# Patient Record
Sex: Female | Born: 1975
Health system: Southern US, Community
[De-identification: ages and names within clinical notes are randomized; demographics above are authoritative.]

## PROBLEM LIST (undated history)

## (undated) DIAGNOSIS — T7840XA Allergy, unspecified, initial encounter: Secondary | ICD-10-CM

## (undated) HISTORY — DX: Allergy, unspecified, initial encounter: T78.40XA

## (undated) HISTORY — PX: APPENDECTOMY: SHX54

---

## 2004-01-16 ENCOUNTER — Encounter: Admission: RE | Admit: 2004-01-16 | Discharge: 2004-01-16 | Payer: Self-pay | Admitting: Specialist

## 2011-07-18 ENCOUNTER — Other Ambulatory Visit (HOSPITAL_COMMUNITY)
Admission: RE | Admit: 2011-07-18 | Discharge: 2011-07-18 | Disposition: A | Discharge status: Home / Self Care | Payer: BC Managed Care – PPO | Source: Ambulatory Visit | Attending: Family Medicine | Admitting: Family Medicine

## 2011-07-18 DIAGNOSIS — Z124 Encounter for screening for malignant neoplasm of cervix: Secondary | ICD-10-CM | POA: Insufficient documentation

## 2011-07-18 DIAGNOSIS — Z1159 Encounter for screening for other viral diseases: Secondary | ICD-10-CM | POA: Insufficient documentation

## 2012-03-23 ENCOUNTER — Other Ambulatory Visit (HOSPITAL_COMMUNITY)
Admission: RE | Admit: 2012-03-23 | Discharge: 2012-03-23 | Disposition: A | Discharge status: Home / Self Care | Payer: BC Managed Care – PPO | Source: Ambulatory Visit | Attending: Obstetrics and Gynecology | Admitting: Obstetrics and Gynecology

## 2012-03-23 DIAGNOSIS — Z1151 Encounter for screening for human papillomavirus (HPV): Secondary | ICD-10-CM | POA: Insufficient documentation

## 2012-03-23 DIAGNOSIS — Z01419 Encounter for gynecological examination (general) (routine) without abnormal findings: Secondary | ICD-10-CM | POA: Insufficient documentation

## 2012-05-27 NOTE — L&D Delivery Note (Signed)
Operative Delivery Note At 5:49 PM a viable female was delivered via Vaginal, Vacuum Investment banker, operational).  Presentation:Vertex ; Position: Left,, Occiput,, Anterior; Station: +3.  Verbal consent: obtained from pt via translation through husband.  Vacuum extraction recommended due to prolonged fetal deceleration.  Risks and benefits discussed in detail.  Risks include, but are not limited to the risks of anesthesia, bleeding, infection, damage to maternal tissues, fetal cephalhematoma.  There is also the risk of inability to effect vaginal delivery of the head, or shoulder dystocia that cannot be resolved by established maneuvers, leading to the need for emergency cesarean section.  Vacuum placed without difficulty.  Pt pushed well and with one pull in green zone, baby brought to crowning and suction released. APGAR: 8, 9; weight Pending .   Placenta status: Intact, Spontaneous.   Cord: 3 vessels with the following complications: Short.  Cord pH: n/a Placenta with marginal insertion, accessory lobe and short cord.  Placenta sent to pathology.  Anesthesia: Epidural  Instruments: Bell Vacuum Episiotomy: None Lacerations: 2nd degree;Perineal;Vaginal Suture Repair: 2.0 3.0 chromic Est. Blood Loss (mL): 400  Mom to postpartum.  Baby to skin to skin.  Geryl Rankins 01/05/2013, 6:36 PM

## 2012-06-04 ENCOUNTER — Other Ambulatory Visit: Payer: Self-pay

## 2012-06-04 LAB — OB RESULTS CONSOLE ANTIBODY SCREEN: Antibody Screen: NEGATIVE

## 2012-06-10 LAB — OB RESULTS CONSOLE GC/CHLAMYDIA
Chlamydia: NEGATIVE
Gonorrhea: NEGATIVE

## 2012-06-30 ENCOUNTER — Other Ambulatory Visit: Payer: Self-pay | Admitting: Obstetrics and Gynecology

## 2012-06-30 DIAGNOSIS — Z3682 Encounter for antenatal screening for nuchal translucency: Secondary | ICD-10-CM

## 2012-07-02 ENCOUNTER — Other Ambulatory Visit: Payer: Self-pay | Admitting: Obstetrics and Gynecology

## 2012-07-02 ENCOUNTER — Ambulatory Visit (HOSPITAL_COMMUNITY): Admission: RE | Admit: 2012-07-02 | Payer: BC Managed Care – PPO | Source: Ambulatory Visit

## 2012-07-02 ENCOUNTER — Encounter (HOSPITAL_COMMUNITY): Payer: Self-pay

## 2012-07-02 ENCOUNTER — Ambulatory Visit (HOSPITAL_COMMUNITY)
Admission: RE | Admit: 2012-07-02 | Discharge: 2012-07-02 | Disposition: A | Discharge status: Home / Self Care | Payer: BC Managed Care – PPO | Source: Ambulatory Visit | Attending: Obstetrics and Gynecology | Admitting: Obstetrics and Gynecology

## 2012-07-02 ENCOUNTER — Other Ambulatory Visit: Payer: Self-pay

## 2012-07-02 DIAGNOSIS — Z3682 Encounter for antenatal screening for nuchal translucency: Secondary | ICD-10-CM

## 2012-07-02 DIAGNOSIS — O3510X0 Maternal care for (suspected) chromosomal abnormality in fetus, unspecified, not applicable or unspecified: Secondary | ICD-10-CM | POA: Insufficient documentation

## 2012-07-02 DIAGNOSIS — O351XX0 Maternal care for (suspected) chromosomal abnormality in fetus, not applicable or unspecified: Secondary | ICD-10-CM | POA: Insufficient documentation

## 2012-07-02 DIAGNOSIS — O09529 Supervision of elderly multigravida, unspecified trimester: Secondary | ICD-10-CM | POA: Insufficient documentation

## 2012-07-02 DIAGNOSIS — Z3689 Encounter for other specified antenatal screening: Secondary | ICD-10-CM | POA: Insufficient documentation

## 2012-07-02 NOTE — Progress Notes (Signed)
Genetic Counseling  High-Risk Gestation Note  Appointment Date:  07/02/2012 Referred By: Geryl Rankins, MD Date of Birth:  12-27-1975 Partner:  Rosine Door    Pregnancy History: [id number] Estimated Date of Delivery: 01/13/13 Estimated Gestational Age: [redacted]w[redacted]d Attending: Particia Nearing, MD    Crystal David and her husband, Mr. Fredda Hammed, were seen for genetic counseling because of a maternal age of 37 y.o..  Laotian/English interpreter, Simmaly, from [employer] provided interpretation for today's visit.   They were counseled regarding maternal age and the association with risk for chromosome conditions due to nondisjunction with aging of the ova.   We reviewed chromosomes, nondisjunction, and the associated 1 in 36 risk for fetal aneuploidy related to a maternal age of 37 y.o. at [redacted]w[redacted]d gestation.  They were counseled that the risk for aneuploidy decreases as gestational age increases, accounting for those pregnancies which spontaneously abort.  We specifically discussed Down syndrome (trisomy 32), trisomies 78 and 38, and sex chromosome aneuploidies (47,XXX and 47,XXY) including the common features and prognoses of each.   We reviewed available screening options including First screen, Quad screen, noninvasive prenatal testing (NIPT), and detailed ultrasound. They understands that screening tests are used to modify a patient's a priori risk for aneuploidy, typically based on age.  This estimate provides a pregnancy specific risk assessment.  Specifically, we discussed that NIPT analyzes cell free fetal DNA found in the maternal circulation. This test is not diagnostic for chromosome conditions, but can provide information regarding the presence or absence of extra fetal DNA for chromosomes 13, 18, 21, X, and Y, and missing fetal DNA for chromosome X and Y (Turner syndrome). Thus, it would not identify or rule out all genetic conditions. The reported detection rate is greater than 99%  for Trisomy 21, greater than 98% for Trisomy 18, and is approximately 80% (8 out of 10) for Trisomy 13. The false positive rate is reported to be less than 0.1% for any of these conditions.  In addition, we discussed that ~50-80% of fetuses with Down syndrome and up to 90-95% of fetuses with trisomy 18/13, when well visualized, have detectable anomalies or soft markers by detailed ultrasound (~18+ weeks gestation).   They were also counseled regarding diagnostic testing via CVS or amniocentesis.  We reviewed the approximate 1 in 100 risk for complications with CVS and the approximate 1 in 300-500 risk for complications for amniocentesis, including spontaneous pregnancy loss. We discussed the risks, limitations, and benefits of each screening and testing option. After consideration of all the options, they elected to proceed with cell free fetal DNA testing ([facility]) at the time of today's visit. Those results will be available in 8-10 days. The couple declined invasive testing (CVS and amniocentesis) given the associated risk of complications. They also elected to proceed with ultrasound for nuchal translucency (NT) assessment. NT measurement was not able to be obtained at the time of today's ultrasound. Ultrasound report will be sent under separate cover. Follow-up ultrasound for second NT measurement attempt was scheduled for 07/16/12. They understand that ultrasound cannot rule out all birth defects or genetic syndromes. Detailed ultrasound is scheduled for 08/13/12.   Both family histories were reviewed and found to be noncontributory for birth defects, mental retardation, and known genetic conditions. Without further information regarding the provided family history, an accurate genetic risk cannot be calculated. Further genetic counseling is warranted if more information is obtained.  The father of the pregnancy reported that he is 39 years old. This couple was  counseled that advanced paternal age (APA) is  defined as paternal age greater than or equal to age 11.  Recent large-scale sequencing studies have shown that approximately 80% of de novo point mutations are of paternal origin.  Many studies have demonstrated a strong correlation between increased paternal age and de novo point mutations.  Although no specific data is available regarding fetal risks for fathers 6+ years old at conception, it is apparent that the overall risk for single gene conditions is increased.  To estimate the relative increase in risk of a genetic disorder with APA, the heritability of the disease must be considered.  Assuming an approximate 2x increase in risk for conditions that are exclusively paternal in origin, the risk for each individual condition is still relatively low.  It is estimated that the overall chance for a de novo mutation is ~0.5%.  We also discussed the wide range of conditions which can be caused by new dominant gene mutations (achondroplasia, neurofibromatosis, Marfan syndrome etc.).  They were counseled that genetic testing for each individual single gene condition is not warranted or available unless ultrasound or family history concerns lend suspicion to a specific condition.      Mrs. Alyvia Gopaul denied exposure to environmental toxins or chemical agents. She denied the use of alcohol, tobacco or street drugs. She denied significant viral illnesses during the course of her pregnancy. Her medical and surgical histories were contributory for three previous TABs.   I counseled this couple regarding the above risks and available options.  The approximate face-to-face time with the genetic counselor was 45 minutes.  Quinn Plowman, MS,  Certified Genetic Counselor 07/02/2012

## 2012-07-07 ENCOUNTER — Encounter (HOSPITAL_COMMUNITY): Payer: BC Managed Care – PPO

## 2012-07-07 ENCOUNTER — Other Ambulatory Visit (HOSPITAL_COMMUNITY): Payer: BC Managed Care – PPO

## 2012-07-13 ENCOUNTER — Telehealth (HOSPITAL_COMMUNITY): Payer: Self-pay | Admitting: MS"

## 2012-07-13 ENCOUNTER — Ambulatory Visit: Payer: Self-pay | Admitting: Internal Medicine

## 2012-07-13 VITALS — BP 109/79 | HR 77 | Temp 98.3°F | Resp 17 | Ht 63.0 in | Wt 144.0 lb

## 2012-07-13 DIAGNOSIS — B9689 Other specified bacterial agents as the cause of diseases classified elsewhere: Secondary | ICD-10-CM

## 2012-07-13 DIAGNOSIS — Z3490 Encounter for supervision of normal pregnancy, unspecified, unspecified trimester: Secondary | ICD-10-CM

## 2012-07-13 DIAGNOSIS — Z331 Pregnant state, incidental: Secondary | ICD-10-CM

## 2012-07-13 DIAGNOSIS — J019 Acute sinusitis, unspecified: Secondary | ICD-10-CM

## 2012-07-13 DIAGNOSIS — J329 Chronic sinusitis, unspecified: Secondary | ICD-10-CM

## 2012-07-13 MED ORDER — AZITHROMYCIN 250 MG PO TABS: ORAL_TABLET | ORAL | Status: DC

## 2012-07-13 NOTE — Patient Instructions (Signed)
Complete the antibiotic as directed. Put warm moist compresses over the sinus areas 3 times daily. You may take benadryl 1 tab every 6 hours if needed for nasal congestion .acetaminophen 325 mg 2 tabs every 6 hours if needed for pain. increase fluids. See the ob gyn dr nad return to this office if your symptoms worsen or do not improve.

## 2012-07-13 NOTE — Telephone Encounter (Signed)
Left message via [facility] [place] interpreter, # [phone] for patient to return call.   Clydie Braun Clement Deneault 07/13/2012 11:44 AM

## 2012-07-13 NOTE — Progress Notes (Signed)
  Subjective:    Patient ID: Crystal David, female    DOB: May 21, 1976, 37 y.o.   MRN: [id number]  HPI Nasal congetion stuffiness, pain on the left side of the face and forehead. Nasal drip Onset 2 weeks ago. Pt is pregnant 3 month and edc is 8/20. First pregnancy. Uses steroid nasal spray and otc allergy meds. No fever no cough no shortness of breath. Nasal discharge is clear and at times bloody. Left Facial pain is moderate in severity   Review of Systems  Constitutional: Negative.   HENT: Positive for congestion and rhinorrhea.        Left facial pain  Eyes: Negative.   Respiratory: Negative.   Cardiovascular: Negative.   Gastrointestinal: Negative.   Endocrine: Negative.   Genitourinary: Negative.   Musculoskeletal: Negative.   Skin: Negative.   Allergic/Immunologic: Positive for environmental allergies.  Neurological: Positive for headaches.  Hematological: Negative.   Psychiatric/Behavioral: Negative.   All other systems reviewed and are negative.       Objective:   Physical Exam  Vitals reviewed. Constitutional: She is oriented to person, place, and time. She appears well-developed and well-nourished.  HENT:  Head: Normocephalic and atraumatic.  Right Ear: External ear normal.  Left Ear: External ear normal.  Mouth/Throat: Oropharynx is clear and moist.  Nasal congestion and erythema of the turbinates.tenderness over the left frontal and maxillary sinusses  Eyes: Conjunctivae and EOM are normal. Pupils are equal, round, and reactive to light.  Neck: Normal range of motion.  Cardiovascular: Normal rate, regular rhythm, normal heart sounds and intact distal pulses.   Pulmonary/Chest: Effort normal and breath sounds normal.  Abdominal: Soft. Bowel sounds are normal.  Musculoskeletal: Normal range of motion.  Neurological: She is alert and oriented to person, place, and time. She has normal reflexes.  Skin: Skin is warm and dry.  Psychiatric: She has a normal  mood and affect. Judgment and thought content normal.          Assessment & Plan:  Facial pain and tenderness over the sinuseswith bloody nasal congestion  in a pt who is 3 months pregnant l Is getting prenatal care from the [facility] gyn group Dr. Delia Heady. Will rx sinusitis with antibiotics and thepain with acetaminophen. Pt is instructed to follow up with her ob-gyn and this office if no improvementl

## 2012-07-15 ENCOUNTER — Other Ambulatory Visit: Payer: Self-pay | Admitting: Obstetrics and Gynecology

## 2012-07-15 DIAGNOSIS — Z3682 Encounter for antenatal screening for nuchal translucency: Secondary | ICD-10-CM

## 2012-07-16 ENCOUNTER — Other Ambulatory Visit (HOSPITAL_COMMUNITY): Payer: BC Managed Care – PPO

## 2012-07-16 ENCOUNTER — Ambulatory Visit (HOSPITAL_COMMUNITY)
Admission: RE | Admit: 2012-07-16 | Discharge: 2012-07-16 | Disposition: A | Discharge status: Home / Self Care | Payer: BC Managed Care – PPO | Source: Ambulatory Visit | Attending: Obstetrics and Gynecology | Admitting: Obstetrics and Gynecology

## 2012-07-16 DIAGNOSIS — Z3682 Encounter for antenatal screening for nuchal translucency: Secondary | ICD-10-CM

## 2012-07-16 DIAGNOSIS — Z36 Encounter for antenatal screening of mother: Secondary | ICD-10-CM | POA: Insufficient documentation

## 2012-07-16 NOTE — Progress Notes (Signed)
  Met with Crystal David to discuss her [facility], cell free fetal DNA testing. Testing was offered because of advanced maternal age. We reviewed that these are within normal limits, showing a less than 1 in 10,000 risk for trisomies 21, 18 and 13. We reviewed that this testing identifies > 99% of pregnancies with trisomy 21, >98% of pregnancies with trisomy 30, and >80% with trisomy 68; the false positive rate is <0.1% for all conditions. Testing was also performed for X and Y chromosome analysis. This did not show evidence of aneuploidy for X or Y. It was also consistent with female gender. X and Y analysis has a detection rate of approximately 99%. She understands that this testing does not identify all genetic conditions. All questions were answered to her satisfaction, she was encouraged to call with additional questions or concerns.  Quinn Plowman, MS Certified Genetic Counselor 07/16/2012 4:08 PM

## 2012-08-11 ENCOUNTER — Other Ambulatory Visit: Payer: Self-pay | Admitting: Obstetrics and Gynecology

## 2012-08-11 DIAGNOSIS — Z0489 Encounter for examination and observation for other specified reasons: Secondary | ICD-10-CM

## 2012-08-11 DIAGNOSIS — IMO0002 Reserved for concepts with insufficient information to code with codable children: Secondary | ICD-10-CM

## 2012-08-13 ENCOUNTER — Encounter (HOSPITAL_COMMUNITY): Payer: Self-pay

## 2012-08-13 ENCOUNTER — Ambulatory Visit (HOSPITAL_COMMUNITY)
Admission: RE | Admit: 2012-08-13 | Discharge: 2012-08-13 | Disposition: A | Discharge status: Home / Self Care | Payer: BC Managed Care – PPO | Source: Ambulatory Visit | Attending: Obstetrics and Gynecology | Admitting: Obstetrics and Gynecology

## 2012-08-13 DIAGNOSIS — IMO0002 Reserved for concepts with insufficient information to code with codable children: Secondary | ICD-10-CM

## 2012-08-13 DIAGNOSIS — Z363 Encounter for antenatal screening for malformations: Secondary | ICD-10-CM | POA: Insufficient documentation

## 2012-08-13 DIAGNOSIS — Z0489 Encounter for examination and observation for other specified reasons: Secondary | ICD-10-CM

## 2012-08-13 DIAGNOSIS — Z1389 Encounter for screening for other disorder: Secondary | ICD-10-CM | POA: Insufficient documentation

## 2012-08-13 DIAGNOSIS — O358XX0 Maternal care for other (suspected) fetal abnormality and damage, not applicable or unspecified: Secondary | ICD-10-CM | POA: Insufficient documentation

## 2012-08-13 DIAGNOSIS — O09529 Supervision of elderly multigravida, unspecified trimester: Secondary | ICD-10-CM | POA: Insufficient documentation

## 2012-09-03 ENCOUNTER — Other Ambulatory Visit: Payer: Self-pay | Admitting: Obstetrics and Gynecology

## 2012-09-03 DIAGNOSIS — O09529 Supervision of elderly multigravida, unspecified trimester: Secondary | ICD-10-CM

## 2012-09-10 ENCOUNTER — Ambulatory Visit (HOSPITAL_COMMUNITY)
Admission: RE | Admit: 2012-09-10 | Discharge: 2012-09-10 | Disposition: A | Discharge status: Home / Self Care | Payer: BC Managed Care – PPO | Source: Ambulatory Visit | Attending: Obstetrics and Gynecology | Admitting: Obstetrics and Gynecology

## 2012-09-10 VITALS — BP 112/76 | HR 112 | Wt 163.5 lb

## 2012-09-10 DIAGNOSIS — Z3689 Encounter for other specified antenatal screening: Secondary | ICD-10-CM | POA: Insufficient documentation

## 2012-09-10 DIAGNOSIS — O09529 Supervision of elderly multigravida, unspecified trimester: Secondary | ICD-10-CM

## 2012-09-10 NOTE — Progress Notes (Signed)
Crystal David was seen for ultrasound appointment today.  Please see [facility]-OBGYN report for details.

## 2012-12-11 ENCOUNTER — Ambulatory Visit (INDEPENDENT_AMBULATORY_CARE_PROVIDER_SITE_OTHER): Payer: BC Managed Care – PPO | Admitting: Family Medicine

## 2012-12-11 VITALS — BP 104/60 | HR 90 | Temp 98.0°F | Resp 18 | Ht 63.0 in | Wt 185.0 lb

## 2012-12-11 DIAGNOSIS — Z0000 Encounter for general adult medical examination without abnormal findings: Secondary | ICD-10-CM

## 2012-12-11 LAB — BASIC METABOLIC PANEL
BUN: 8 mg/dL (ref 6–23)
Calcium: 9.9 mg/dL (ref 8.4–10.5)
Chloride: 102 mEq/L (ref 96–112)
Creat: 0.52 mg/dL (ref 0.50–1.10)
Potassium: 4.3 mEq/L (ref 3.5–5.3)
Sodium: 135 mEq/L (ref 135–145)

## 2012-12-11 LAB — LIPID PANEL
HDL: 69 mg/dL (ref >39)
Triglycerides: 550 mg/dL — ABNORMAL HIGH (ref <150)

## 2012-12-11 NOTE — Progress Notes (Signed)
[facility] [address], [place] [address] [phone]  Date:  12/11/2012   Name:  Crystal David   DOB:  07/28/1975   MRN:  [id number]  PCP:  No PCP Per Patient    Chief Complaint: Annual Exam   History of Present Illness:  Crystal David is a 37 y.o. very pleasant female patient who presents with the following:  Here today for a CPE- for her huband's employer provided insurance.  She has a form to complete.  However, she is also pregnant and due next month.  She has no medical problems and does not smoke.   She is followed by [facility].  Per her report her pregnancy is going well, she is not sure if she received a Tdap already   There are no active problems to display for this patient.   Past Medical History  Diagnosis Date  . Allergy     History reviewed. No pertinent past surgical history.  History  Substance Use Topics  . Smoking status: Never Smoker   . Smokeless tobacco: Not on file  . Alcohol Use: No    History reviewed. No pertinent family history.  No Known Allergies  Medication list has been reviewed and updated.  Current Outpatient Prescriptions on File Prior to Visit  Medication Sig Dispense Refill  . Prenatal Multivit-Min-Fe-FA (PRE-NATAL PO) Take by mouth.      . triamcinolone (NASACORT) 55 MCG/ACT nasal inhaler Place 2 sprays into the nose daily.      . Ascorbic Acid (VITAMIN C) 100 MG tablet Take 100 mg by mouth daily.      Marland Kitchen azithromycin (ZITHROMAX) 250 MG tablet Take 2 tabs daly 1 then 1 tab daily for the following 4 days.  6 each  0   No current facility-administered medications on file prior to visit.    Review of Systems:  As per HPI- otherwise negative.   Physical Examination: Filed Vitals:   12/11/12 1303  BP: 104/60  Pulse: 90  Temp: 98 F (36.7 C)  Resp: 18   Filed Vitals:   12/11/12 1303  Height: 5\' 3"  (1.6 m)  Weight: 185 lb (83.915 kg)   Body mass index is 32.78 kg/(m^2). Ideal  Body Weight: Weight in (lb) to have BMI = 25: 140.8  GEN: WDWN, NAD, Non-toxic, A & O x 3, pregnant HEENT: Atraumatic, Normocephalic. Neck supple. No masses, No LAD.  Bilateral TM wnl, oropharynx normal.  PEERL,EOMI.   Ears and Nose: No external deformity. CV: RRR, No M/G/R. No JVD. No thrill. No extra heart sounds. PULM: CTA B, no wheezes, crackles, rhonchi. No retractions. No resp. distress. No accessory muscle use. ABD: pregnant.  Normal gravid abdomen  EXTR: No c/c.  Minimal pedal edema bilaterally  NEURO Normal gait.  PSYCH: Normally interactive. Conversant. Not depressed or anxious appearing.  Calm demeanor.    Assessment and Plan: Physical exam - Plan: Basic metabolic panel, Lipid panel  Performed labs as above.  Asked pt to find out about her Tdap status as she may need a shot prior to delivery.  Completed form but did indicate that she is pregnant as this affects her BMI and could alter her labs.   Signed Abbe Amsterdam, MD

## 2012-12-11 NOTE — Patient Instructions (Addendum)
I will mail you your labs.  Your labs will include your cholesterol and blood sugar information.  I will mark these labs for you to turn in with the rest of your forms. If there is any problem with your weight or labs come and see [place] after you deliver.  However, I did indicate on your form that you are currently pregnant, so your weight is expected to be higher than normal.    Congratulations!

## 2012-12-12 ENCOUNTER — Encounter: Payer: Self-pay | Admitting: Family Medicine

## 2013-01-05 ENCOUNTER — Inpatient Hospital Stay (HOSPITAL_COMMUNITY): Payer: BC Managed Care – PPO | Admitting: Anesthesiology

## 2013-01-05 ENCOUNTER — Inpatient Hospital Stay (HOSPITAL_COMMUNITY)
Admission: AD | Admit: 2013-01-05 | Discharge: 2013-01-07 | DRG: 372 | Disposition: A | Discharge status: Home / Self Care | Payer: BC Managed Care – PPO | Source: Ambulatory Visit | Attending: Obstetrics and Gynecology | Admitting: Obstetrics and Gynecology

## 2013-01-05 ENCOUNTER — Encounter (HOSPITAL_COMMUNITY): Payer: Self-pay | Admitting: Anesthesiology

## 2013-01-05 ENCOUNTER — Encounter (HOSPITAL_COMMUNITY): Payer: Self-pay | Admitting: *Deleted

## 2013-01-05 DIAGNOSIS — O4290 Premature rupture of membranes, unspecified as to length of time between rupture and onset of labor, unspecified weeks of gestation: Secondary | ICD-10-CM | POA: Diagnosis present

## 2013-01-05 DIAGNOSIS — O878 Other venous complications in the puerperium: Secondary | ICD-10-CM | POA: Diagnosis present

## 2013-01-05 DIAGNOSIS — K649 Unspecified hemorrhoids: Secondary | ICD-10-CM | POA: Diagnosis present

## 2013-01-05 DIAGNOSIS — O09529 Supervision of elderly multigravida, unspecified trimester: Secondary | ICD-10-CM | POA: Diagnosis present

## 2013-01-05 LAB — TYPE AND SCREEN
ABO/RH(D): O POS
Antibody Screen: NEGATIVE

## 2013-01-05 LAB — COMPREHENSIVE METABOLIC PANEL
Albumin: 2.8 g/dL — ABNORMAL LOW (ref 3.5–5.2)
BUN: 13 mg/dL (ref 6–23)
Creatinine, Ser: 0.48 mg/dL — ABNORMAL LOW (ref 0.50–1.10)
GFR calc Af Amer: >90 mL/min (ref >90)
Glucose, Bld: 83 mg/dL (ref 70–99)
Total Protein: 6.6 g/dL (ref 6.0–8.3)

## 2013-01-05 LAB — CBC
MCV: 86.8 fL (ref 78.0–100.0)
Platelets: 188 10*3/uL (ref 150–400)
RBC: 4.25 MIL/uL (ref 3.87–5.11)
WBC: 10.8 10*3/uL — ABNORMAL HIGH (ref 4.0–10.5)

## 2013-01-05 LAB — RPR: RPR Ser Ql: NONREACTIVE

## 2013-01-05 LAB — ABO/RH: ABO/RH(D): O POS

## 2013-01-05 LAB — GROUP B STREP BY PCR: Group B strep by PCR: NEGATIVE

## 2013-01-05 MED ORDER — CITRIC ACID-SODIUM CITRATE 334-500 MG/5ML PO SOLN
30.0000 mL | ORAL | Status: DC | PRN
  Filled 2013-01-05: qty 15

## 2013-01-05 MED ORDER — LANOLIN HYDROUS EX OINT: TOPICAL_OINTMENT | CUTANEOUS | Status: DC | PRN

## 2013-01-05 MED ORDER — ZOLPIDEM TARTRATE 5 MG PO TABS: 5.0000 mg | ORAL_TABLET | Freq: Every evening | ORAL | Status: DC | PRN

## 2013-01-05 MED ORDER — FLEET ENEMA 7-19 GM/118ML RE ENEM: 1.0000 | ENEMA | Freq: Once | RECTAL | Status: DC

## 2013-01-05 MED ORDER — DIBUCAINE 1 % RE OINT
1.0000 "application " | TOPICAL_OINTMENT | RECTAL | Status: DC | PRN
  Filled 2013-01-05: qty 28

## 2013-01-05 MED ORDER — TETANUS-DIPHTH-ACELL PERTUSSIS 5-2.5-18.5 LF-MCG/0.5 IM SUSP: 0.5000 mL | Freq: Once | INTRAMUSCULAR | Status: DC

## 2013-01-05 MED ORDER — DIPHENHYDRAMINE HCL 25 MG PO CAPS: 25.0000 mg | ORAL_CAPSULE | Freq: Four times a day (QID) | ORAL | Status: DC | PRN

## 2013-01-05 MED ORDER — MEASLES, MUMPS & RUBELLA VAC ~~LOC~~ INJ
0.5000 mL | INJECTION | Freq: Once | SUBCUTANEOUS | Status: DC
  Filled 2013-01-05: qty 0.5

## 2013-01-05 MED ORDER — FERROUS SULFATE 325 (65 FE) MG PO TABS
325.0000 mg | ORAL_TABLET | Freq: Two times a day (BID) | ORAL | Status: DC
  Filled 2013-01-05: qty 1

## 2013-01-05 MED ORDER — PHENYLEPHRINE 40 MCG/ML (10ML) SYRINGE FOR IV PUSH (FOR BLOOD PRESSURE SUPPORT)
80.0000 ug | PREFILLED_SYRINGE | INTRAVENOUS | Status: DC | PRN
  Filled 2013-01-05: qty 2

## 2013-01-05 MED ORDER — WITCH HAZEL-GLYCERIN EX PADS
1.0000 "application " | MEDICATED_PAD | CUTANEOUS | Status: DC | PRN
  Administered 2013-01-06: 1 via TOPICAL

## 2013-01-05 MED ORDER — LACTATED RINGERS IV SOLN
500.0000 mL | INTRAVENOUS | Status: DC | PRN
  Administered 2013-01-05: 1000 mL via INTRAVENOUS
  Administered 2013-01-05: 500 mL via INTRAVENOUS

## 2013-01-05 MED ORDER — ONDANSETRON HCL 4 MG/2ML IJ SOLN: 4.0000 mg | INTRAMUSCULAR | Status: DC | PRN

## 2013-01-05 MED ORDER — GENTAMICIN SULFATE 40 MG/ML IJ SOLN
120.0000 mg | Freq: Three times a day (TID) | INTRAVENOUS | Status: DC
  Administered 2013-01-05 (×2): 120 mg via INTRAVENOUS
  Filled 2013-01-05 (×4): qty 3

## 2013-01-05 MED ORDER — OXYCODONE-ACETAMINOPHEN 5-325 MG PO TABS: 1.0000 | ORAL_TABLET | ORAL | Status: DC | PRN

## 2013-01-05 MED ORDER — LACTATED RINGERS IV SOLN
INTRAVENOUS | Status: DC
  Administered 2013-01-05 (×3): via INTRAVENOUS

## 2013-01-05 MED ORDER — BENZOCAINE-MENTHOL 20-0.5 % EX AERO
1.0000 "application " | INHALATION_SPRAY | CUTANEOUS | Status: DC | PRN
  Administered 2013-01-06: 1 via TOPICAL
  Filled 2013-01-05: qty 56

## 2013-01-05 MED ORDER — FENTANYL 2.5 MCG/ML BUPIVACAINE 1/10 % EPIDURAL INFUSION (WH - ANES)
14.0000 mL/h | INTRAMUSCULAR | Status: DC | PRN
  Administered 2013-01-05: 14 mL/h via EPIDURAL
  Filled 2013-01-05: qty 125

## 2013-01-05 MED ORDER — IBUPROFEN 600 MG PO TABS
600.0000 mg | ORAL_TABLET | Freq: Four times a day (QID) | ORAL | Status: DC
  Administered 2013-01-06 – 2013-01-07 (×7): 600 mg via ORAL
  Filled 2013-01-05 (×7): qty 1

## 2013-01-05 MED ORDER — WITCH HAZEL-GLYCERIN EX PADS: 1.0000 "application " | MEDICATED_PAD | CUTANEOUS | Status: DC | PRN

## 2013-01-05 MED ORDER — LACTATED RINGERS IV SOLN: 500.0000 mL | Freq: Once | INTRAVENOUS | Status: DC

## 2013-01-05 MED ORDER — ONDANSETRON HCL 4 MG/2ML IJ SOLN: 4.0000 mg | Freq: Four times a day (QID) | INTRAMUSCULAR | Status: DC | PRN

## 2013-01-05 MED ORDER — BUTORPHANOL TARTRATE 1 MG/ML IJ SOLN: 1.0000 mg | INTRAMUSCULAR | Status: DC | PRN

## 2013-01-05 MED ORDER — MEDROXYPROGESTERONE ACETATE 150 MG/ML IM SUSP: 150.0000 mg | INTRAMUSCULAR | Status: DC | PRN

## 2013-01-05 MED ORDER — LIDOCAINE HCL (PF) 1 % IJ SOLN
INTRAMUSCULAR | Status: DC | PRN
  Administered 2013-01-05 (×2): 5 mL

## 2013-01-05 MED ORDER — SIMETHICONE 80 MG PO CHEW: 80.0000 mg | CHEWABLE_TABLET | ORAL | Status: DC | PRN

## 2013-01-05 MED ORDER — METHYLERGONOVINE MALEATE 0.2 MG PO TABS: 0.2000 mg | ORAL_TABLET | ORAL | Status: DC | PRN

## 2013-01-05 MED ORDER — PHENYLEPHRINE 40 MCG/ML (10ML) SYRINGE FOR IV PUSH (FOR BLOOD PRESSURE SUPPORT)
80.0000 ug | PREFILLED_SYRINGE | INTRAVENOUS | Status: DC | PRN
  Filled 2013-01-05: qty 5
  Filled 2013-01-05: qty 2

## 2013-01-05 MED ORDER — ONDANSETRON HCL 4 MG PO TABS: 4.0000 mg | ORAL_TABLET | ORAL | Status: DC | PRN

## 2013-01-05 MED ORDER — TETANUS-DIPHTH-ACELL PERTUSSIS 5-2.5-18.5 LF-MCG/0.5 IM SUSP
0.5000 mL | Freq: Once | INTRAMUSCULAR | Status: DC
  Filled 2013-01-05: qty 0.5

## 2013-01-05 MED ORDER — PRENATAL MULTIVITAMIN CH
1.0000 | ORAL_TABLET | Freq: Every day | ORAL | Status: DC
  Administered 2013-01-06 – 2013-01-07 (×2): 1 via ORAL
  Filled 2013-01-05 (×3): qty 1

## 2013-01-05 MED ORDER — OXYCODONE-ACETAMINOPHEN 5-325 MG PO TABS
1.0000 | ORAL_TABLET | ORAL | Status: DC | PRN
  Administered 2013-01-07: 1 via ORAL
  Filled 2013-01-05: qty 1

## 2013-01-05 MED ORDER — TERBUTALINE SULFATE 1 MG/ML IJ SOLN: 0.2500 mg | Freq: Once | INTRAMUSCULAR | Status: DC | PRN

## 2013-01-05 MED ORDER — OXYTOCIN 40 UNITS IN LACTATED RINGERS INFUSION - SIMPLE MED: 62.5000 mL/h | INTRAVENOUS | Status: DC | PRN

## 2013-01-05 MED ORDER — EPHEDRINE 5 MG/ML INJ
10.0000 mg | INTRAVENOUS | Status: DC | PRN
  Filled 2013-01-05: qty 2

## 2013-01-05 MED ORDER — IBUPROFEN 600 MG PO TABS: 600.0000 mg | ORAL_TABLET | Freq: Four times a day (QID) | ORAL | Status: DC

## 2013-01-05 MED ORDER — SENNOSIDES-DOCUSATE SODIUM 8.6-50 MG PO TABS: 2.0000 | ORAL_TABLET | Freq: Every day | ORAL | Status: DC

## 2013-01-05 MED ORDER — EPHEDRINE 5 MG/ML INJ
10.0000 mg | INTRAVENOUS | Status: DC | PRN
  Filled 2013-01-05: qty 4
  Filled 2013-01-05: qty 2

## 2013-01-05 MED ORDER — OXYTOCIN 40 UNITS IN LACTATED RINGERS INFUSION - SIMPLE MED
1.0000 m[IU]/min | INTRAVENOUS | Status: DC
  Administered 2013-01-05: 1 m[IU]/min via INTRAVENOUS
  Filled 2013-01-05: qty 1000

## 2013-01-05 MED ORDER — DIBUCAINE 1 % RE OINT
1.0000 "application " | TOPICAL_OINTMENT | RECTAL | Status: DC | PRN
  Administered 2013-01-06: 1 via RECTAL
  Filled 2013-01-05: qty 28

## 2013-01-05 MED ORDER — PRENATAL MULTIVITAMIN CH: 1.0000 | ORAL_TABLET | Freq: Every day | ORAL | Status: DC

## 2013-01-05 MED ORDER — SENNOSIDES-DOCUSATE SODIUM 8.6-50 MG PO TABS
2.0000 | ORAL_TABLET | Freq: Every day | ORAL | Status: DC
  Administered 2013-01-05 – 2013-01-06 (×2): 2 via ORAL

## 2013-01-05 MED ORDER — SODIUM CHLORIDE 0.9 % IV SOLN
2.0000 g | Freq: Four times a day (QID) | INTRAVENOUS | Status: DC
  Administered 2013-01-05 (×3): 2 g via INTRAVENOUS
  Filled 2013-01-05 (×5): qty 2000

## 2013-01-05 MED ORDER — MAGNESIUM HYDROXIDE 400 MG/5ML PO SUSP: 30.0000 mL | ORAL | Status: DC | PRN

## 2013-01-05 MED ORDER — DIPHENHYDRAMINE HCL 50 MG/ML IJ SOLN: 12.5000 mg | INTRAMUSCULAR | Status: DC | PRN

## 2013-01-05 MED ORDER — OXYTOCIN BOLUS FROM INFUSION: 500.0000 mL | INTRAVENOUS | Status: DC

## 2013-01-05 MED ORDER — METHYLERGONOVINE MALEATE 0.2 MG/ML IJ SOLN: 0.2000 mg | INTRAMUSCULAR | Status: DC | PRN

## 2013-01-05 MED ORDER — LACTATED RINGERS IV SOLN
INTRAVENOUS | Status: DC
  Administered 2013-01-05: 09:00:00 via INTRAUTERINE

## 2013-01-05 MED ORDER — ACETAMINOPHEN 325 MG PO TABS: 650.0000 mg | ORAL_TABLET | ORAL | Status: DC | PRN

## 2013-01-05 MED ORDER — IBUPROFEN 600 MG PO TABS: 600.0000 mg | ORAL_TABLET | Freq: Four times a day (QID) | ORAL | Status: DC | PRN

## 2013-01-05 MED ORDER — LIDOCAINE HCL (PF) 1 % IJ SOLN
30.0000 mL | INTRAMUSCULAR | Status: DC | PRN
  Filled 2013-01-05 (×2): qty 30

## 2013-01-05 MED ORDER — BENZOCAINE-MENTHOL 20-0.5 % EX AERO
1.0000 "application " | INHALATION_SPRAY | CUTANEOUS | Status: DC | PRN
  Filled 2013-01-05: qty 56

## 2013-01-05 MED ORDER — OXYTOCIN 40 UNITS IN LACTATED RINGERS INFUSION - SIMPLE MED
62.5000 mL/h | INTRAVENOUS | Status: DC
  Administered 2013-01-05: 999 mL/h via INTRAVENOUS

## 2013-01-05 NOTE — H&P (Signed)
Sarann Roell is a 37 y.o. female G4P0030 @ 54 6/7 weeks with h/o premature ovarian failure presented overnight with c/o big gush of fluid and pinkish discharge.  Leaking started 4 days ago.  Contractions have started within the last few hours.  Moderate pain.    Maternal Medical History:  Reason for admission: Rupture of membranes.   Contractions: Onset was 6-12 hours ago.   Frequency: irregular.   Perceived severity is moderate.    Fetal activity: Perceived fetal activity is normal.   Last perceived fetal movement was within the past hour.    Prenatal complications: Bleeding.   Excessive weight gain ~55 lb.  Prenatal Complications - Diabetes: none.    OB History   Grav Para Term Preterm Abortions TAB SAB Ect Mult Living   4 0 0 0 3 3   0 0     Past Medical History  Diagnosis Date  . Allergy    Past Surgical History  Procedure Laterality Date  . Appendectomy     Family History: family history is not on file. Social History:  reports that she has never smoked. She does not have any smokeless tobacco history on file. She reports that she does not drink alcohol or use illicit drugs.   Prenatal Transfer Tool  Maternal Diabetes: No Genetic Screening: Declined Maternal Ultrasounds/Referrals: Normal Fetal Ultrasounds or other Referrals:  Referred to Materal Fetal Medicine  Maternal Substance Abuse:  No Significant Maternal Medications:  None Significant Maternal Lab Results:  Lab values include: Group B Strep negative Other Comments:  H/o premature ovarian failure.  Weight gain of ~60 lbs.  Review of Systems  Constitutional: Negative for fever and chills.  Eyes:       No visual changes.  Gastrointestinal: Positive for abdominal pain.  Neurological: Negative for headaches.    Dilation: 1 Effacement (%): 60 Station: -2 Exam by:: Dr Dion Body Blood pressure 145/95, pulse 89, temperature 98.2 F (36.8 C), temperature source Oral, resp. rate 18, height 5' (1.524 m),  weight 87.658 kg (193 lb 4 oz), last menstrual period 04/08/2012, SpO2 100.00%. Maternal Exam:  Uterine Assessment: Contraction strength is moderate.  Contraction frequency is regular.   Abdomen: Patient reports no abdominal tenderness. Fetal presentation: vertex  Introitus: Normal vulva. Ferning test: positive.  Amniotic fluid character: meconium stained.  Pelvis: adequate for delivery.   Cervix: Cervix evaluated by digital exam.     Fetal Exam Fetal Monitor Review: Mode: fetoscope.   Baseline rate: Baseline changes 120s to 150s.  Variability: moderate (6-25 bpm).   Pattern: accelerations present, variable decelerations and late decelerations.    Fetal State Assessment: Category II but now Category I.  Physical Exam  Constitutional: She is oriented to person, place, and time. She appears well-developed and well-nourished. No distress.  HENT:  Head: Normocephalic and atraumatic.  Neck: Normal range of motion.  Cardiovascular: Normal rate, regular rhythm and normal heart sounds.   No murmur heard. Respiratory: No respiratory distress. She exhibits no tenderness.  GI:  No RUQ pain.  Genitourinary: Vagina normal.  Musculoskeletal: Normal range of motion. She exhibits edema.  Neurological: She is alert and oriented to person, place, and time. She has normal reflexes.  Skin: Skin is warm and dry. She is not diaphoretic.  Psychiatric: She has a normal mood and affect.    Prenatal labs: ABO, Rh: --/--/O POS, O POS (08/12 0255) Antibody: NEG (08/12 0255) Rubella: Immune (01/09 0000) RPR: Nonreactive (01/09 0000)  HBsAg: Negative (01/09 0000)  HIV: Non-reactive (  05/29 0000)  GBS: Negative (08/12 0000)   Assessment/Plan: IUP at Term Prolonged PROM Nonreassuring fetal tracing now improved.   Meconium.  Amnioinfusion. Pt history obtained and cesarean section consent obtained with help of Language line. Epidural per anesthesia. C-section for recurrent decels. Pt afebrile  and WBC is normal.  Continue Amp/Gent.  Geryl Rankins 01/05/2013, 8:39 AM

## 2013-01-05 NOTE — Progress Notes (Signed)
Discussed second stage with [facility].  Answered questions for pt and FOB.

## 2013-01-05 NOTE — MAU Provider Note (Signed)
Chief Complaint:  Rupture of Membranes  First Provider Initiated Contact with Patient 01/05/13 0151    HPI: Crystal David is a 37 y.o. G4P0030 at [redacted]w[redacted]d who presents to maternity admissions reporting leaking clear fluid since 0300 @ 01/02/13. Mild contractions. Scant vaginal bleeding. Good fetal movement. Denies fever and chills.  Past Medical History: Past Medical History  Diagnosis Date  . Allergy     Past obstetric history: OB History   Grav Para Term Preterm Abortions TAB SAB Ect Mult Living   4 0 0 0 3 3   0 0     # Outc Date GA Lbr Len/2nd Wgt Sex Del Anes PTL Lv   1 TAB            2 TAB            3 TAB            4 CUR               Past Surgical History: No past surgical history on file.  Family History: No family history on file.  Social History: History  Substance Use Topics  . Smoking status: Never Smoker   . Smokeless tobacco: Not on file  . Alcohol Use: No    Allergies: No Known Allergies  Meds:  Prescriptions prior to admission  Medication Sig Dispense Refill  . Ascorbic Acid (VITAMIN C) 100 MG tablet Take 100 mg by mouth daily.      Marland Kitchen azithromycin (ZITHROMAX) 250 MG tablet Take 2 tabs daly 1 then 1 tab daily for the following 4 days.  6 each  0  . Prenatal Multivit-Min-Fe-FA (PRE-NATAL PO) Take by mouth.      . triamcinolone (NASACORT) 55 MCG/ACT nasal inhaler Place 2 sprays into the nose daily.        ROS: Pertinent findings in history of present illness.  Physical Exam  Blood pressure 109/72, pulse 86, temperature 97.1 F (36.2 C), temperature source Oral, resp. rate 18, last menstrual period 04/08/2012, SpO2 100.00%. GENERAL: Well-developed, well-nourished female in no acute distress.  HEENT: normocephalic HEART: normal rate RESP: normal effort ABDOMEN: Soft, non-tender, gravid appropriate for gestational age EXTREMITIES: Nontender, no edema NEURO: alert and oriented SPECULUM EXAM: NEFG, physiologic discharge, no blood, cervix clean.  Neg pooling Dilation: 1 Effacement (%): 60 Cervical Position: Posterior Station: -2 Presentation: Vertex Exam by:: V.Brandi Armato,CNM  FHT:  Baseline 130 , moderate variability, accelerations present, variable deceleration x 1 Contractions: rare, mild   Labs: Results for orders placed during the hospital encounter of 01/05/13 (from the past 24 hour(s))  POCT FERN TEST     Status: None   Collection Time    01/05/13  2:13 AM      Result Value Range   POCT Fern Test Positive = ruptured amniotic membanes      Imaging:  No results found. MAU Course:   Assessment: PROM  Plan: Admit to BS per Dr. Richardson Dopp.   [place], CNM 01/05/2013 2:23 AM

## 2013-01-05 NOTE — Anesthesia Procedure Notes (Signed)
Epidural Patient location during procedure: OB Start time: 01/05/2013 12:38 PM  Staffing Anesthesiologist: Angus Seller., Harrell Gave. Performed by: anesthesiologist   Preanesthetic Checklist Completed: patient identified, site marked, surgical consent, pre-op evaluation, timeout performed, IV checked, risks and benefits discussed and monitors and equipment checked  Epidural Patient position: sitting Prep: site prepped and draped and DuraPrep Patient monitoring: continuous pulse ox and blood pressure Approach: midline Injection technique: LOR air and LOR saline  Needle:  Needle type: Tuohy  Needle gauge: 17 G Needle length: 9 cm and 9 Needle insertion depth: 5 cm cm Catheter type: closed end flexible Catheter size: 19 Gauge Catheter at skin depth: 10 cm Test dose: negative  Assessment Events: blood not aspirated, injection not painful, no injection resistance, negative IV test and no paresthesia  Additional Notes Patient identified.  Risk benefits discussed including failed block, incomplete pain control, headache, nerve damage, paralysis, blood pressure changes, nausea, vomiting, reactions to medication both toxic or allergic, and postpartum back pain.  Patient expressed understanding and wished to proceed.  All questions were answered.  Sterile technique used throughout procedure and epidural site dressed with sterile barrier dressing. No paresthesia or other complications noted.The patient did not experience any signs of intravascular injection such as tinnitus or metallic taste in mouth nor signs of intrathecal spread such as rapid motor block. Please see nursing notes for vital signs.

## 2013-01-05 NOTE — Progress Notes (Signed)
ANTIBIOTIC CONSULT NOTE - INITIAL  Pharmacy Consult for Gentamicin Indication: PROM  No Known Allergies  Patient Measurements: Height: 5' (152.4 cm) Weight: 193 lb 4 oz (87.658 kg) IBW/kg (Calculated) : 45.5 kg Adjusted Body Weight: 58 kg  Vital Signs: Temp: 97.8 F (36.6 C) (08/12 0330) Temp src: Oral (08/12 0330) BP: 145/95 mmHg (08/12 0330) Pulse Rate: 89 (08/12 0330)  Labs:  Recent Labs  01/05/13 0255  WBC 10.8*  HGB 12.7  PLT 188      Microbiology: Recent Results (from the past 720 hour(s))  GROUP B STREP BY PCR     Status: None   Collection Time    01/05/13  3:10 AM      Result Value Range Status   Group B strep by PCR NEGATIVE  NEGATIVE Final   Comment:            The Xpert GBS PCR Assay is FDA     approved for intrapartum     specimens only.     Positive results are presumptive.    Medications:  Ampicillin 2 grams IV Q 6 hr  Assessment: 37 y.o. female G4P0030 at [redacted]w[redacted]d, ruptured since 01/02/13 @ 03:00  Estimated Ke = 0.275, Vd = 0.33 L/kg  Goal of Therapy:  Gentamicin peak 6-8 mg/L and Trough < 1 mg/L  Plan:  Gentamicin 120 mg IV every 8 hrs  Check Scr with next labs if gentamicin continued. Will check gentamicin levels if continued > 72hr or clinically indicated.  Natasha Bence 01/05/2013,4:33 AM

## 2013-01-05 NOTE — Progress Notes (Addendum)
[employer] Interpretation used to translate plan of care for today.  Discussed length of SROM, FHT's and SVD vs. C/S.  Answered questions for patient, family and MD.  Consent obtained for possible C/S--risks explained to pt and FOB with them verbalizing understanding.  Will proceed with amnioinfusion and follow pitocin protocol if FHT's are reactive and reassuring.

## 2013-01-05 NOTE — Anesthesia Preprocedure Evaluation (Signed)

## 2013-01-05 NOTE — MAU Note (Signed)
Pt reports ? Leaking fluid since 3 am on 01/02/2013, has continued off/on for the last 3 days, no pain

## 2013-01-05 NOTE — Progress Notes (Signed)
Pt states she is tired.  Would like to rest for 30 min.  Pt repositioned to right side.  Monitors adjusted.

## 2013-01-05 NOTE — Progress Notes (Signed)
Crystal David is a 37 y.o. G4P0030 at [redacted]w[redacted]d   Subjective: Pt with complaint of epigastric pain since epidural placed.  Intermittent.  More comfortable since epidural. RN reports Pitocin was discontinued due to fetal decelerations.  Objective: BP 128/87  Pulse 90  Temp(Src) 99 F (37.2 C) (Oral)  Resp 16  Ht 5' (1.524 m)  Wt 87.658 kg (193 lb 4 oz)  BMI 37.74 kg/m2  SpO2 88%  LMP 04/08/2012      FHT:  FHR: 130 bpm, variability: moderate,  accelerations:  Present,  decelerations:  Present variable , recurrent-resolved with change of position UC:   regular, every 4-5 minutes SVE:   Dilation: 10 Effacement (%): 100 Station: 0 Exam by:: dr. Ocie Bob  Labs: Lab Results  Component Value Date   WBC 10.8* 01/05/2013   HGB 12.7 01/05/2013   HCT 36.9 01/05/2013   MCV 86.8 01/05/2013   PLT 188 01/05/2013    Assessment / Plan: Term pregnancy with nonreassuring fetal tracing, which has now resolved Complete dilitation.  Start to push.  Labor: Progressing normally Preeclampsia:  BP normal, labs normal Fetal Wellbeing:  Category I Pain Control:  Epidural I/D:  Amp/Gent Anticipated MOD:  NSVD  Jak Haggar 01/05/2013, 1:34 PM

## 2013-01-06 ENCOUNTER — Encounter (HOSPITAL_COMMUNITY): Payer: Self-pay | Admitting: *Deleted

## 2013-01-06 LAB — CBC
MCH: 29.8 pg (ref 26.0–34.0)
MCHC: 34.1 g/dL (ref 30.0–36.0)
MCV: 87.2 fL (ref 78.0–100.0)
Platelets: 165 10*3/uL (ref 150–400)
RDW: 14.1 % (ref 11.5–15.5)
WBC: 13.4 10*3/uL — ABNORMAL HIGH (ref 4.0–10.5)

## 2013-01-06 NOTE — Progress Notes (Signed)
Post Partum Day  Subjective: Pt with c/o abdominal cramping and hemorrhoid pain.  Feels pain medication is not strong enough.  Bleeding not to heavy.  Breast feeding.  Objective: Blood pressure 113/73, pulse 90, temperature 98.1 F (36.7 C), temperature source Oral, resp. rate 19, height 5' (1.524 m), weight 87.658 kg (193 lb 4 oz), last menstrual period 04/08/2012, SpO2 99.00%, unknown if currently breastfeeding.  Physical Exam:  General: alert, cooperative and no distress Lochia: appropriate Uterine Fundus: firm DVT Evaluation: No evidence of DVT seen on physical exam. Calf/Ankle edema is present. Vulva not edematous.    Recent Labs  01/05/13 0255 01/06/13 0610  HGB 12.7 10.0*  HCT 36.9 29.3*    Assessment/Plan: Plan for discharge tomorrow, Breastfeeding, Lactation consult and Circumcision prior to discharge Medication log reviewed and pt has not been given Percocet.  RN informed and asked to administer. Circumcision this afternoon.  Consented after delivery. Routine postpartum care.  Encouraged ambulation.  LOS: 1 day   Ion Gonnella 01/06/2013, 9:45 AM

## 2013-01-06 NOTE — Anesthesia Postprocedure Evaluation (Signed)
  Anesthesia Post-op Note  Patient: Crystal David  Procedure(s) Performed: * No procedures listed *  Patient Location: PACU and Mother/Baby  Anesthesia Type:Epidural  Level of Consciousness: awake, alert  and oriented  Airway and Oxygen Therapy: Patient Spontanous Breathing  Post-op Pain: mild  Post-op Assessment: Patient's Cardiovascular Status Stable, Respiratory Function Stable, Patent Airway and NAUSEA AND VOMITING PRESENT  Post-op Vital Signs: stable  Complications: No apparent anesthesia complications

## 2013-01-07 MED ORDER — OXYCODONE-ACETAMINOPHEN 5-325 MG PO TABS: 1.0000 | ORAL_TABLET | ORAL | Status: DC | PRN

## 2013-01-07 MED ORDER — PRAMOXINE-HC 1-1 % EX FOAM: Freq: Three times a day (TID) | CUTANEOUS | Status: AC

## 2013-01-07 MED ORDER — IBUPROFEN 600 MG PO TABS: 600.0000 mg | ORAL_TABLET | Freq: Four times a day (QID) | ORAL | Status: DC

## 2013-01-07 NOTE — Discharge Summary (Signed)
Obstetric Discharge Summary Reason for Admission: onset of labor and rupture of membranes Prenatal Procedures: ultrasound Intrapartum Procedures: vacuum  Fetal decelerations. Postpartum Procedures: none Complications-Operative and Postpartum: 2nd degree perineal laceration Hemoglobin  Date Value Range Status  01/06/2013 10.0* 12.0 - 15.0 g/dL Final     DELTA CHECK NOTED     REPEATED TO VERIFY     HCT  Date Value Range Status  01/06/2013 29.3* 36.0 - 46.0 % Final    Physical Exam:  General: alert, cooperative and no distress Lochia: appropriate Uterine Fundus: firm Incision: n/a DVT Evaluation: No evidence of DVT seen on physical exam. Calf/Ankle edema is present.  Discharge Diagnoses: Term Pregnancy-delivered  Discharge Information: Date: 01/07/2013 Activity: pelvic rest Diet: routine Medications: PNV, Ibuprofen, Percocet and Epifoam for hemorrhoids Condition: stable Instructions: See discharge instructions. Discharge to: home Follow-up Information   Follow up with Geryl Rankins, MD. Schedule an appointment as soon as possible for a visit in 6 weeks. (Postpartum check up)    Specialty:  Obstetrics and Gynecology   Contact information:   [address], [address] [place] [address] [phone]       Newborn Data: Live born female  Birth Weight: 6 lb 1.5 oz (2765 g) APGAR: 8, 9  Home with mother. Circumcision done postpartum day #1  Geryl Rankins 01/07/2013, 12:15 PM

## 2013-01-07 NOTE — Lactation Note (Signed)
This note was copied from the chart of Boy Niamh Tipping. Lactation Consultation Note  Patient Name: Boy Ndea Kilroy [id number] Date: 01/07/2013 Reason for consult: Follow-up assessment Baby is latching well. Assisted mother with a deeper latch to improve milk transfer. Hand expression did not yield colostrum from the other breast during the infants feeding. Mother has had breast changes and experiencing more fullness. Hand pump given with demonstration and advised to use 4-6 times per day after feeding for additional stimulation. Milk expressed may be given to baby by medicine dropper. Baby has a follow up appt. with Dr. Georges Lynch tomorrow. Lactation appt.  scheduled for 01-13-13 for feeding assessment. Baby is voiding and stooling with good output. He is content at the breast.  Maternal Data    Feeding Feeding Type: Breast Milk Length of feed: 45 min  LATCH Score/Interventions Latch: Grasps breast easily, tongue down, lips flanged, rhythmical sucking.  Audible Swallowing: A few with stimulation Intervention(s): Alternate breast massage  Type of Nipple: Everted at rest and after stimulation  Comfort (Breast/Nipple): Soft / non-tender     Hold (Positioning): No assistance needed to correctly position infant at breast. Intervention(s): Breastfeeding basics reviewed  LATCH Score: 9  Lactation Tools Discussed/Used WIC Program:  (recommended contacting insurance compnay about obtaining  DE)   Consult Status      Christella Hartigan M 01/07/2013, 1:33 PM

## 2013-01-13 ENCOUNTER — Ambulatory Visit (HOSPITAL_COMMUNITY)
Admit: 2013-01-13 | Discharge: 2013-01-13 | Disposition: A | Discharge status: Home / Self Care | Payer: BC Managed Care – PPO | Attending: Obstetrics and Gynecology | Admitting: Obstetrics and Gynecology

## 2013-01-13 NOTE — Lactation Note (Signed)
Adult Lactation Consultation Outpatient Visit Note  Patient Name: Crystal David Date of Birth: 01-17-1976 Gestational Age at Delivery: Unknown Type of Delivery:   Infant "Britt Bottom" DOB - 01[birth year] GA - 38.6 Birthweight - 6 lbs, 1.5 oz Discharge weights - 5 lbs, 12.4 oz (5% weight loss at discharge) Last Peds visit - Friday, Aug. 15 weight - 5lbs, 0 oz (increase in weight of 4 oz in 5 days) Mom is a P1 Currently Infant is 44 days old Parents Speak [place] Language and brought with them an interpreter who was present for lactation consultation visit.  Appointment with lactation was made because was not able to hand-express colostrum during hospital stay even though mom did have breast changes during pregnancy.  Mom was discharged with a hand pump and instructed to pump 4-6 times/ day after feedings.  Latch Score in hospital was 9 prior to discharge.  Parents report "not making enough milk."  Breastfeeding History: Frequency of Breastfeeding:  3-5 times per day Length of Feeding: 30-60 min each feeding Voids: 6+ Stools: 4-6  Mom reports infant falls asleep a lot at breasts.  Mom reports she is breastfeeding 3-5 times a day and then gives a bottle of formula 60 ml after each feeding.  Supplementing / Method: Pumping:  Type of Pump: DEBP   Frequency: twice a day (morning and evening) - mom reports she pumps 3 consecutive pumpings over a 1-hr span (Power Pumping) in the morning and again repeats in the evening.    Volume:  15-20 ml with each pumping x 3 sessions in the morning = 60 ml yielded in a 1-hr time span of pumping  Comments: Communication with patient occurred through an interpreter. Lots basic education done about needing to breastfeed infant 8-12+ times a day with feeding cues and appropriate amount of supplementation if needed after feedings.  Additional education given on the need to space pumping sessions throughout the day to attain maximum milk output and encouraged milk  production.  When weighing infant between side LC noted blanching of anterior frenulum with thick submucosal tissue; cannot extend tongue past gum line; no elevation noted, no lateralization noted.  Mom denied any nipples pain or soreness and infant transferred total of 50 ml at current visit.        Consultation Evaluation:  Initial Feeding Assessment: Pre-feed Weight:  2834 g (6 lbs, 4.0 oz) Post-feed Weight: 2884 g (6, 4.4) Amount Transferred: 14 ml Comments:  Mom latched infant in a cradle hold on left side after several attempts to get infant latched.  Infant latched with a wide mouth and flanged lips but mom held infant low and infant had his head turned.  Infant suckled in a consistent pattern for 15 minutes and then came off; few swallows heard.  Additional Feeding Assessment: Pre-feed Weight:  2848 g Post-feed Weight: 2884 g Amount Transferred: 36 ml Comments:  LC assisted mom with latching on right side in cross-cradle hold positioning infant at level of breast facing breast.  Lots basic teaching done with interpreter about latching positioning, and teaching sandwiching technique to attain depth.  Infant suckled for 15 minutes in a more consistent, regular pattern with more frequent swallows heard and then came off.     Total Breast milk Transferred this Visit: 50 ml  Total Supplement Given: 0  Additional Interventions: Written instructions given to mom and communicated through interpreter: 1.  Breastfeed 8-12 times per day - Use feeding log sheets given to parents 2.  Pump at least 6  times per day between breast feedings waiting about 30 minutes after feeding ends.  Pump for 20-30 minutes or until milk flow stops. 3.  Supplement after feedings with pumped breastmilk when available or formula (30-45 ml) if baby still acts hungry after breastfeedings. 4.  Handouts given on lactation cookies and moringa   Follow-Up LC PRN Peds next week    Lendon Ka 01/13/2013,  6:15 PM

## 2013-01-27 ENCOUNTER — Encounter (HOSPITAL_COMMUNITY): Payer: Self-pay

## 2013-01-27 ENCOUNTER — Inpatient Hospital Stay (HOSPITAL_COMMUNITY)
Admission: AD | Admit: 2013-01-27 | Discharge: 2013-01-27 | Disposition: A | Discharge status: Home / Self Care | Payer: BC Managed Care – PPO | Source: Ambulatory Visit | Attending: Obstetrics and Gynecology | Admitting: Obstetrics and Gynecology

## 2013-01-27 DIAGNOSIS — O99893 Other specified diseases and conditions complicating puerperium: Secondary | ICD-10-CM | POA: Insufficient documentation

## 2013-01-27 DIAGNOSIS — R1013 Epigastric pain: Secondary | ICD-10-CM

## 2013-01-27 LAB — URINE MICROSCOPIC-ADD ON

## 2013-01-27 LAB — COMPREHENSIVE METABOLIC PANEL
ALT: 35 U/L (ref 0–35)
Albumin: 3 g/dL — ABNORMAL LOW (ref 3.5–5.2)
Alkaline Phosphatase: 83 U/L (ref 39–117)
BUN: 7 mg/dL (ref 6–23)
Calcium: 8.5 mg/dL (ref 8.4–10.5)
GFR calc Af Amer: >90 mL/min (ref >90)
Glucose, Bld: 92 mg/dL (ref 70–99)
Potassium: 3.4 mEq/L — ABNORMAL LOW (ref 3.5–5.1)
Sodium: 136 mEq/L (ref 135–145)
Total Protein: 6.3 g/dL (ref 6.0–8.3)

## 2013-01-27 LAB — URINALYSIS, ROUTINE W REFLEX MICROSCOPIC
Bilirubin Urine: NEGATIVE
Ketones, ur: NEGATIVE mg/dL
Nitrite: NEGATIVE
Protein, ur: NEGATIVE mg/dL
Specific Gravity, Urine: 1.01 (ref 1.005–1.030)
Urobilinogen, UA: 0.2 mg/dL (ref 0.0–1.0)

## 2013-01-27 LAB — CBC WITH DIFFERENTIAL/PLATELET
Basophils Relative: 1 % (ref 0–1)
Eosinophils Absolute: 0.4 10*3/uL (ref 0.0–0.7)
Eosinophils Relative: 4 % (ref 0–5)
MCH: 29 pg (ref 26.0–34.0)
MCHC: 33.8 g/dL (ref 30.0–36.0)
MCV: 85.8 fL (ref 78.0–100.0)
Neutrophils Relative %: 55 % (ref 43–77)
Platelets: 258 10*3/uL (ref 150–400)
RDW: 13.5 % (ref 11.5–15.5)

## 2013-01-27 MED ORDER — GI COCKTAIL ~~LOC~~: 30.0000 mL | Freq: Two times a day (BID) | ORAL | Status: DC

## 2013-01-27 MED ORDER — GI COCKTAIL ~~LOC~~
30.0000 mL | Freq: Once | ORAL | Status: AC
  Administered 2013-01-27: 30 mL via ORAL
  Filled 2013-01-27: qty 30

## 2013-01-27 NOTE — MAU Note (Signed)
Vaginal delivery about 2 weeks ago. Upper abdominal pain tonight. Denies vaginal bleeding.

## 2013-01-27 NOTE — MAU Provider Note (Signed)
History     CSN: [id number]  Arrival date and time: 01/27/13 0253   None     Chief Complaint  Patient presents with  . Abdominal Pain   Abdominal Pain    Crystal David is a 37 y.o. [id number] who is s/p VAVD. She states that she has epigastric pain that comes and goes. She had the pain earlier tonight, and took pepto bisomul. She states that the pain went away after taking that. She denies any fever, nausea, vomiting, constipation or diarrhea. She states that she last ate chicken and rice at 2100. She is taking percocet and stool softener as prescribed at discharge. She denies taking any other medications.   Past Medical History  Diagnosis Date  . Allergy   . SVD (spontaneous vaginal delivery) 01/05/2013    Past Surgical History  Procedure Laterality Date  . Appendectomy      History reviewed. No pertinent family history.  History  Substance Use Topics  . Smoking status: Never Smoker   . Smokeless tobacco: Not on file  . Alcohol Use: No    Allergies: No Known Allergies  Prescriptions prior to admission  Medication Sig Dispense Refill  . docusate sodium (COLACE) 100 MG capsule Take 100 mg by mouth 2 (two) times daily.      Marland Kitchen acetaminophen (TYLENOL) 325 MG tablet Take 325 mg by mouth every 6 (six) hours as needed for pain.      . Ascorbic Acid (VITAMIN C) 100 MG tablet Take 100 mg by mouth every other day.       . ibuprofen (ADVIL,MOTRIN) 600 MG tablet Take 1 tablet (600 mg total) by mouth every 6 (six) hours.  30 tablet  0  . oxyCODONE-acetaminophen (PERCOCET/ROXICET) 5-325 MG per tablet Take 1-2 tablets by mouth every 4 (four) hours as needed.  10 tablet  0  . pramoxine-hydrocortisone (EPIFOAM) 1-1 % foam Apply topically 3 (three) times daily.  10 g  1  . Prenatal Vit-Fe Fumarate-FA (PRENATAL MULTIVITAMIN) TABS tablet Take 1 tablet by mouth daily at 12 noon.        Review of Systems  Gastrointestinal: Positive for abdominal pain.   Physical Exam   Height 5'  (1.524 m), weight 74.934 kg (165 lb 3.2 oz).  Physical Exam  Nursing note and vitals reviewed. Constitutional: She is oriented to person, place, and time. She appears well-developed and well-nourished. No distress.  Cardiovascular: Normal rate.   Respiratory: Effort normal.  GI: Soft. She exhibits no distension and no mass. There is no tenderness. There is no rebound and no guarding.  Genitourinary:  FF u-3   Neurological: She is alert and oriented to person, place, and time.  Skin: Skin is warm and dry.  Psychiatric: She has a normal mood and affect.    MAU Course  Procedures  Results for orders placed during the hospital encounter of 01/27/13 (from the past 24 hour(s))  URINALYSIS, ROUTINE W REFLEX MICROSCOPIC     Status: Abnormal   Collection Time    01/27/13  3:02 AM      Result Value Range   Color, Urine YELLOW  YELLOW   APPearance CLEAR  CLEAR   Specific Gravity, Urine 1.010  1.005 - 1.030   pH 6.0  5.0 - 8.0   Glucose, UA NEGATIVE  NEGATIVE mg/dL   Hgb urine dipstick LARGE (*) NEGATIVE   Bilirubin Urine NEGATIVE  NEGATIVE   Ketones, ur NEGATIVE  NEGATIVE mg/dL   Protein, ur NEGATIVE  NEGATIVE mg/dL  Urobilinogen, UA 0.2  0.0 - 1.0 mg/dL   Nitrite NEGATIVE  NEGATIVE   Leukocytes, UA LARGE (*) NEGATIVE  URINE MICROSCOPIC-ADD ON     Status: None   Collection Time    01/27/13  3:02 AM      Result Value Range   Squamous Epithelial / LPF RARE  RARE   WBC, UA 11-20  <3 WBC/hpf   RBC / HPF 11-20  <3 RBC/hpf   Bacteria, UA RARE  RARE  CBC WITH DIFFERENTIAL     Status: Abnormal   Collection Time    01/27/13  3:55 AM      Result Value Range   WBC 9.9  4.0 - 10.5 K/uL   RBC 3.79 (*) 3.87 - 5.11 MIL/uL   Hemoglobin 11.0 (*) 12.0 - 15.0 g/dL   HCT 40.9 (*) 81.1 - 91.4 %   MCV 85.8  78.0 - 100.0 fL   MCH 29.0  26.0 - 34.0 pg   MCHC 33.8  30.0 - 36.0 g/dL   RDW 78.2  95.6 - 21.3 %   Platelets 258  150 - 400 K/uL   Neutrophils Relative % 55  43 - 77 %   Neutro Abs 5.4   1.7 - 7.7 K/uL   Lymphocytes Relative 35  12 - 46 %   Lymphs Abs 3.4  0.7 - 4.0 K/uL   Monocytes Relative 7  3 - 12 %   Monocytes Absolute 0.7  0.1 - 1.0 K/uL   Eosinophils Relative 4  0 - 5 %   Eosinophils Absolute 0.4  0.0 - 0.7 K/uL   Basophils Relative 1  0 - 1 %   Basophils Absolute 0.1  0.0 - 0.1 K/uL  COMPREHENSIVE METABOLIC PANEL     Status: Abnormal   Collection Time    01/27/13  3:55 AM      Result Value Range   Sodium 136  135 - 145 mEq/L   Potassium 3.4 (*) 3.5 - 5.1 mEq/L   Chloride 103  96 - 112 mEq/L   CO2 20  19 - 32 mEq/L   Glucose, Bld 92  70 - 99 mg/dL   BUN 7  6 - 23 mg/dL   Creatinine, Ser 0.86  0.50 - 1.10 mg/dL   Calcium 8.5  8.4 - 57.8 mg/dL   Total Protein 6.3  6.0 - 8.3 g/dL   Albumin 3.0 (*) 3.5 - 5.2 g/dL   AST 38 (*) 0 - 37 U/L   ALT 35  0 - 35 U/L   Alkaline Phosphatase 83  39 - 117 U/L   Total Bilirubin 0.2 (*) 0.3 - 1.2 mg/dL   GFR calc non Af Amer >90  >90 mL/min   GFR calc Af Amer >90  >90 mL/min     0330: Dr. Richardson Dopp was on the unit to see the patient. She asked that we evaluate and treat.  Assessment and Plan   1. Epigastric abdominal pain    RX: GI cocktail  FU with Dr. Dion Body Consider outpatient abdominal US if symptoms persist.   [name] 01/27/2013, 3:41 AM

## 2013-01-27 NOTE — MAU Note (Signed)
Pt states via [place] interpreter that she has upper abdominal pain since Sunday that comes and goes. It feels like it is squeezing. Feels like she has chills and cold. No n/v. Took pepto bismol to help with the pain. It seems to have helped, but then the pain came back.

## 2013-09-13 ENCOUNTER — Other Ambulatory Visit (HOSPITAL_COMMUNITY)
Admission: RE | Admit: 2013-09-13 | Discharge: 2013-09-13 | Disposition: A | Discharge status: Home / Self Care | Payer: BC Managed Care – PPO | Source: Ambulatory Visit | Attending: Obstetrics and Gynecology | Admitting: Obstetrics and Gynecology

## 2013-09-13 ENCOUNTER — Other Ambulatory Visit: Payer: Self-pay | Admitting: Obstetrics and Gynecology

## 2013-09-13 DIAGNOSIS — Z1151 Encounter for screening for human papillomavirus (HPV): Secondary | ICD-10-CM | POA: Insufficient documentation

## 2013-09-13 DIAGNOSIS — Z01419 Encounter for gynecological examination (general) (routine) without abnormal findings: Secondary | ICD-10-CM | POA: Insufficient documentation

## 2014-03-28 ENCOUNTER — Encounter (HOSPITAL_COMMUNITY): Payer: Self-pay

## 2014-10-27 IMAGING — US US OB NUCHAL TRANSLUCENCY 1ST GEST
1 series · 13 of 20 positions shown · non-contrast
Comparison: none

[Series 1: us ob nuchal translucency 1st gest · 0.23mm/px · 13 of 20 slices shown]
[im 1/20]
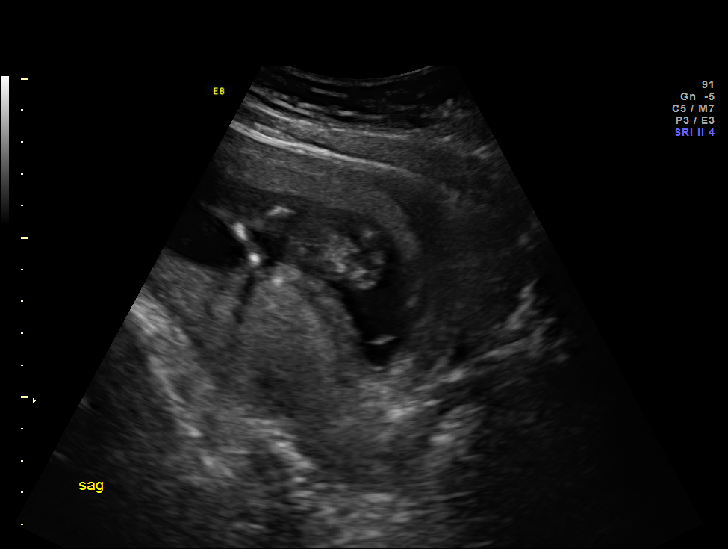
[im 3/20]
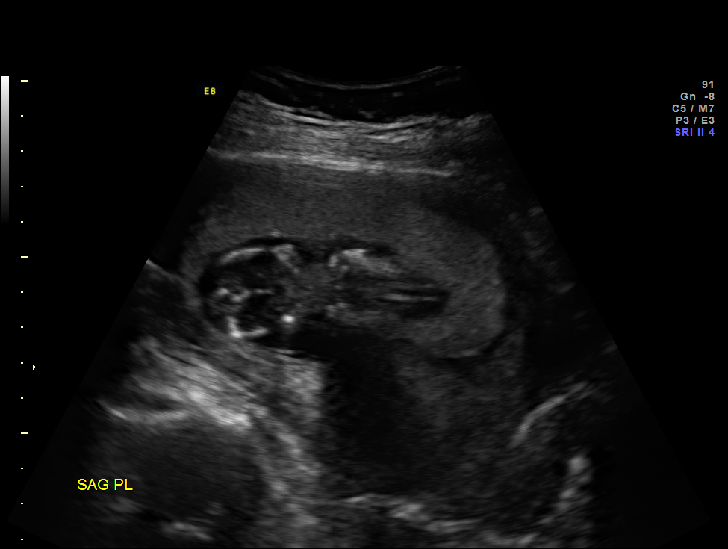
[im 4/20]
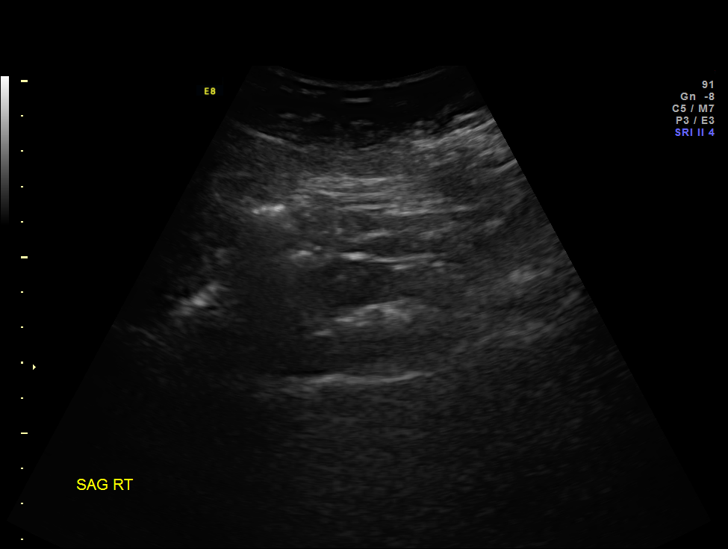
[im 6/20]
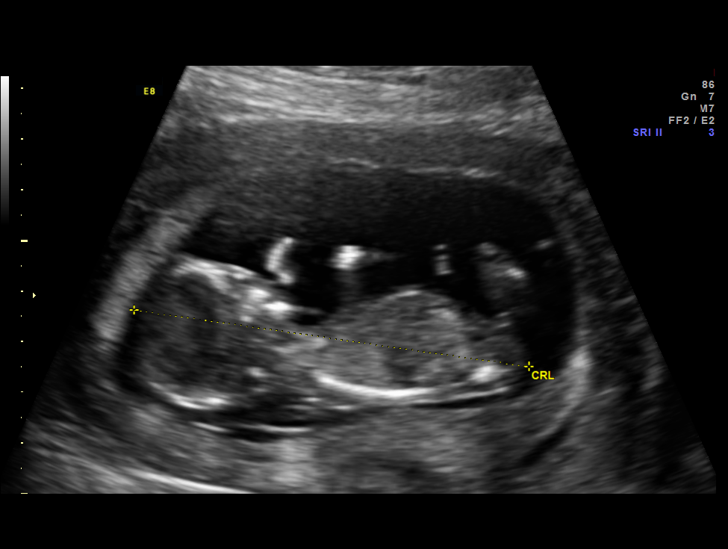
[im 7/20]
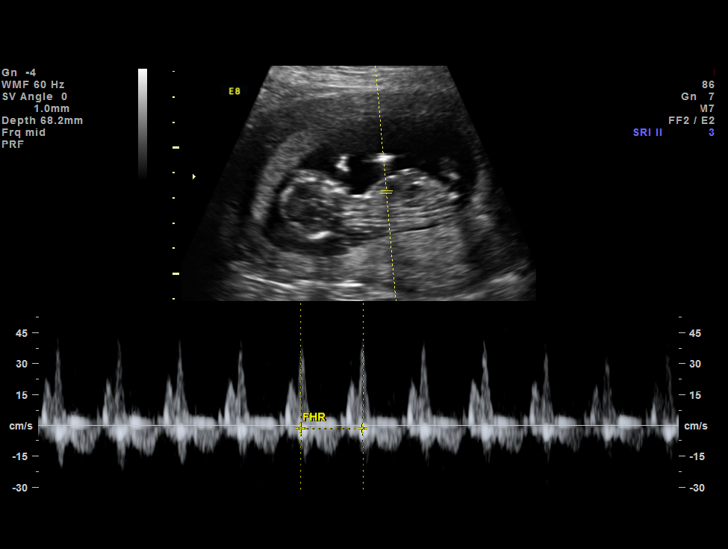
[im 9/20]
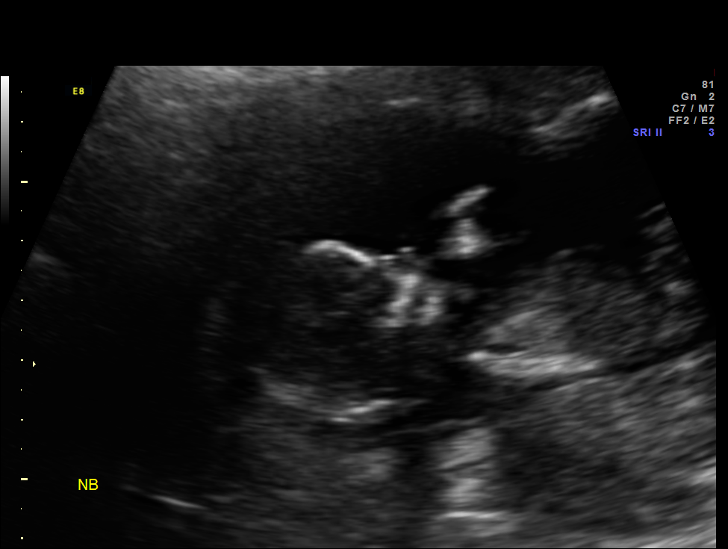
[im 11/20]
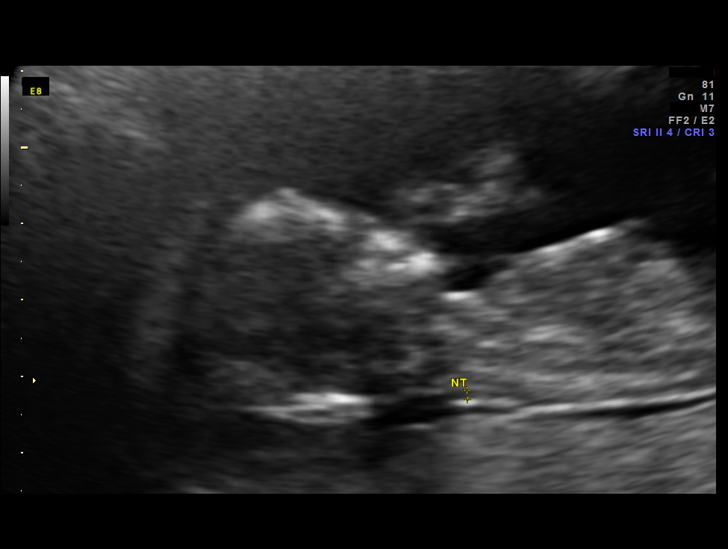
[im 12/20]
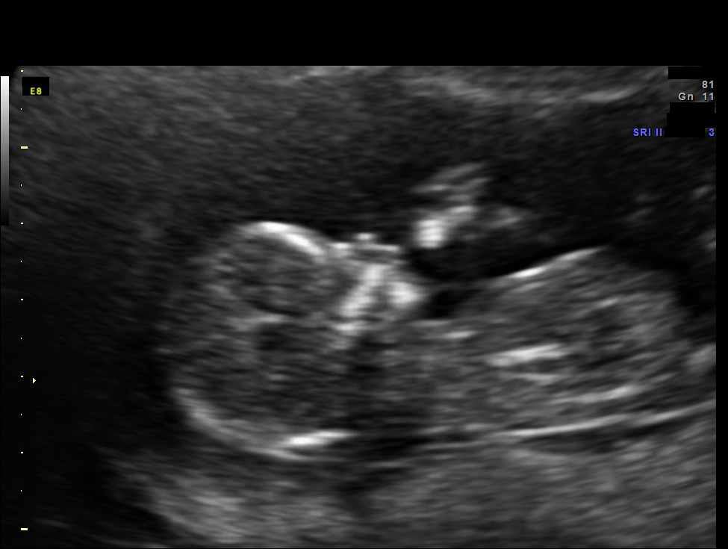
[im 14/20]
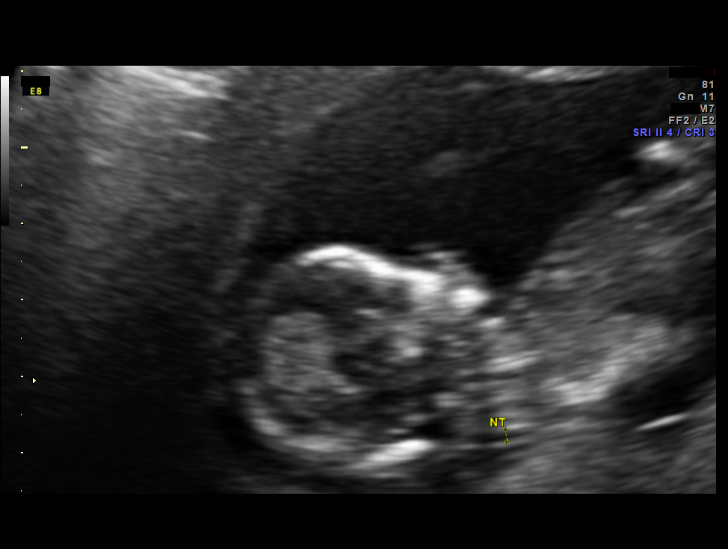
[im 15/20]
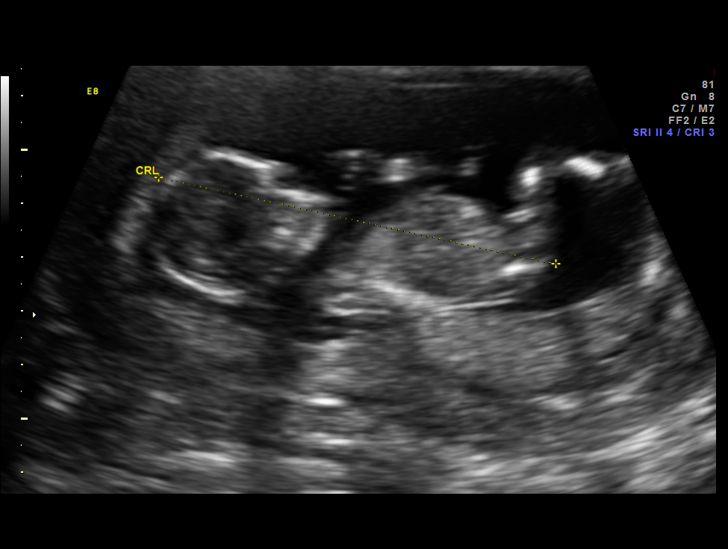
[im 17/20]
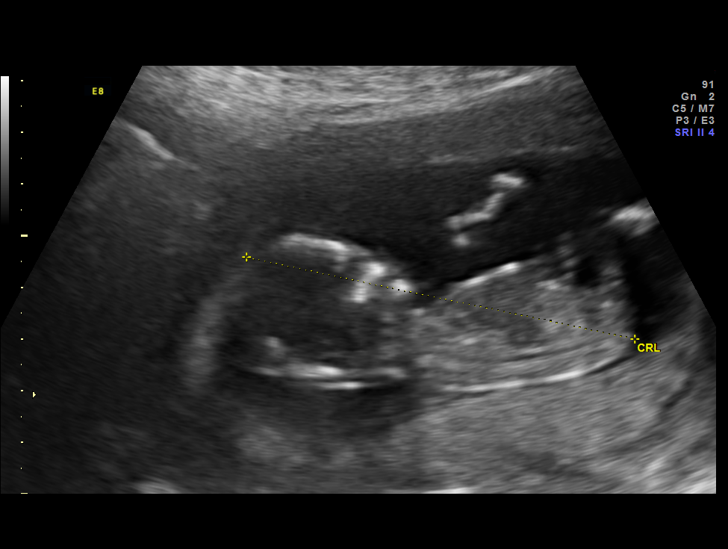
[im 18/20]
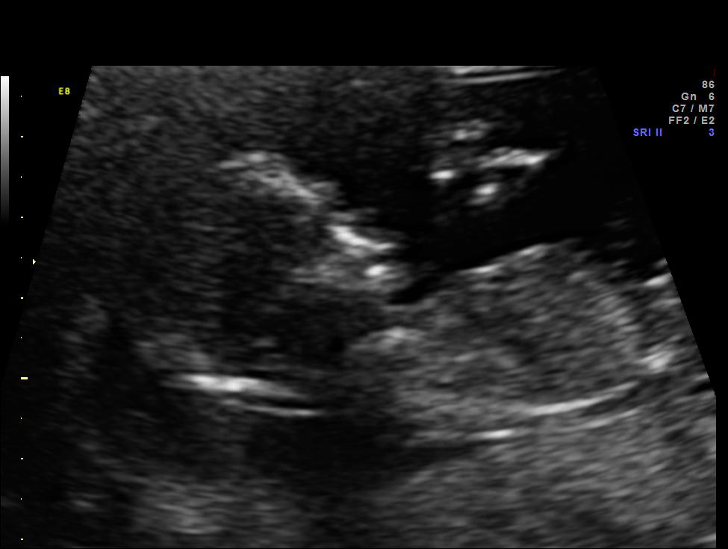
[im 20/20]
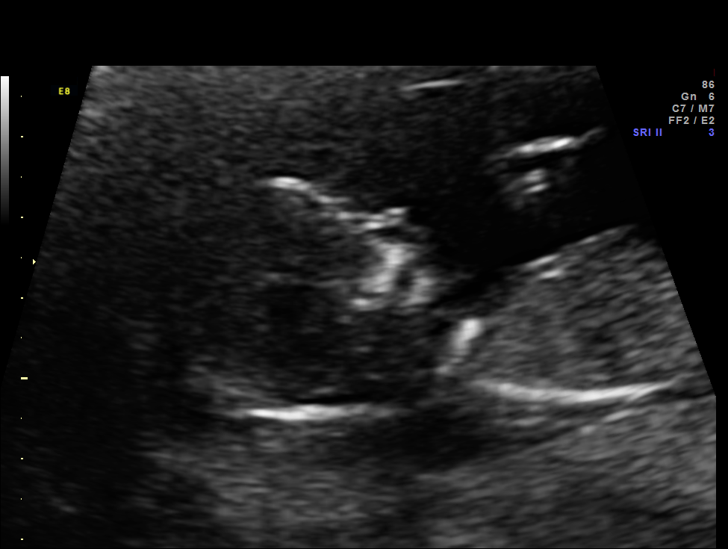

[13 of 20 positions shown; findings below may reference images not displayed]

OBSTETRICS REPORT
                      (Signed Final 07/20/2012 [DATE])

Service(s) Provided

 US FETAL NUCHAL TRANSLUCENCY                          76813.0
 MEASUREMENT
Indications

 First trimester aneuploidy screen (NT)
 Advanced maternal age (AMA), Multigravida;
 screen negative [facility]
Fetal Evaluation

 Num Of Fetuses:    1
 Fetal Heart Rate:  161                          bpm
 Cardiac Activity:  Observed
Gestational Age

 LMP:           14w 1d        Date:  04/08/12                 EDD:   01/13/13
 Best:          13w 5d     Det. By:  Early Ultrasound         EDD:   01/16/13
                                     (06/09/12)
1st Trimester Genetic Sonogram Screening

 CRL:            76.8  mm    G. Age:   13w 3d                 EDD:   01/18/13
 Nuc Trans:       1.8  mm

 Nasal Bone:                 Present
Cervix Uterus Adnexa

 Cervix:       Normal appearance by transabdominal scan.

 Left Ovary:    Not visualized. No adnexal mass visualized.
 Right Ovary:   Not visualized. No adnexal mass visualized.
Impression

 IUP at 13+5 weeks
 No gross abnormalities identified
 NT measurement was within normal limits for this GA; NB
 present
 Normal amniotic fluid volume
 Measurements consistent with prior US
Recommendations

 Follow-up ultrasound in 4-5 weeks for detailed anatomic
 survey
 Offer MSAFP in the second trimester for ONTD screening

## 2014-11-24 IMAGING — US US OB DETAIL+14 WK
1 series · 12 of 28 positions shown · non-contrast
Comparison: none

[Series 1: us ob detail+14 wk · 0.19mm/px · 12 of 77 slices shown]
[im 3/77]
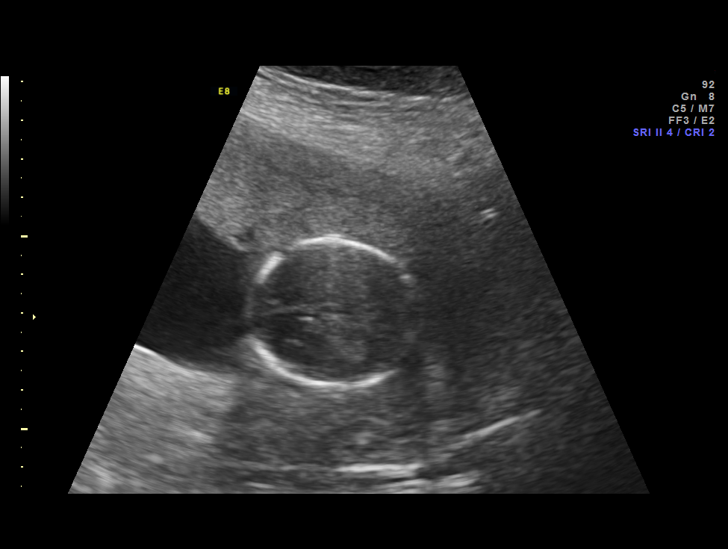
[im 9/77]
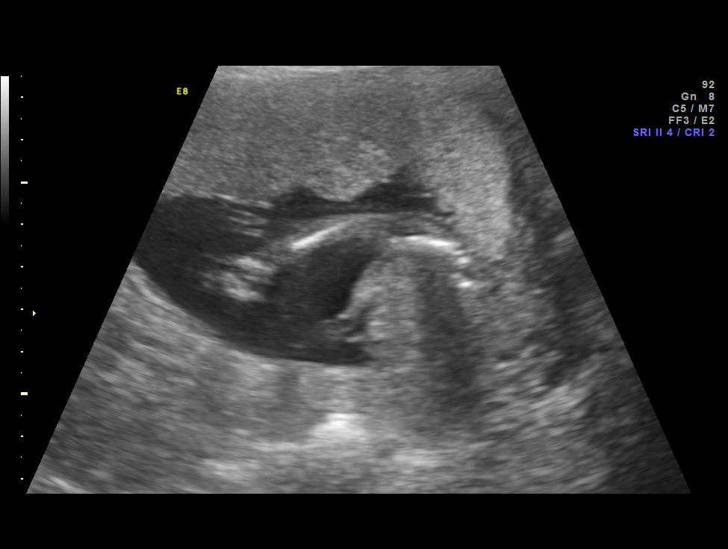
[im 15/77]
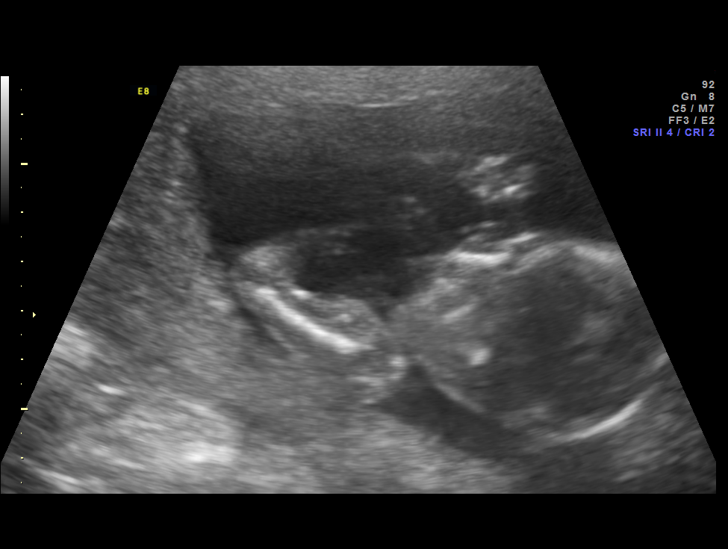
[im 23/77]
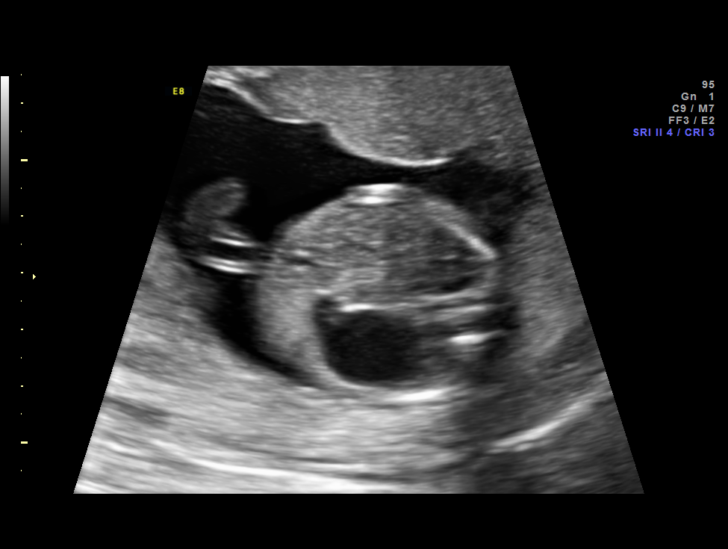
[im 29/77]
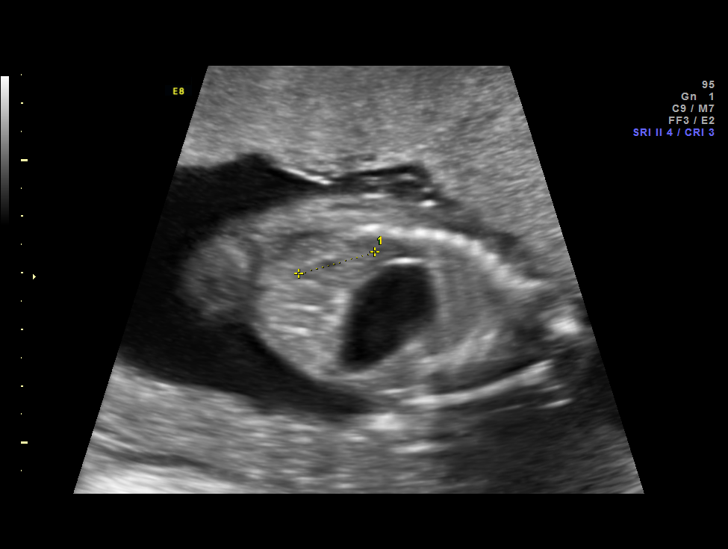
[im 34/77]
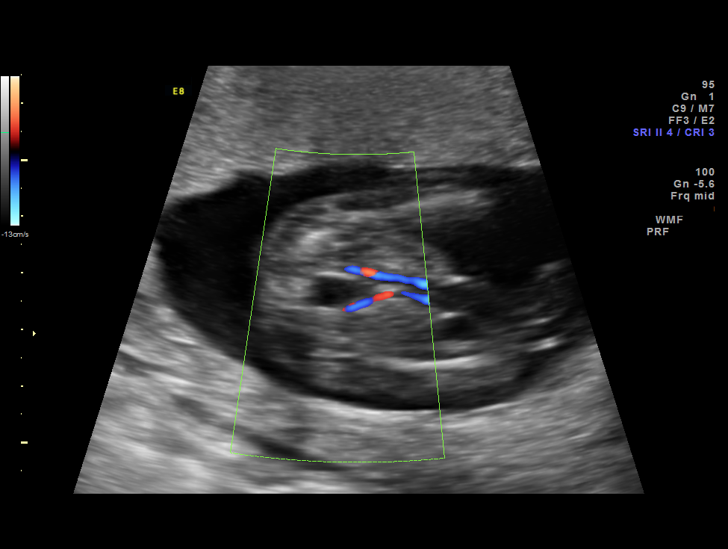
[im 43/77]
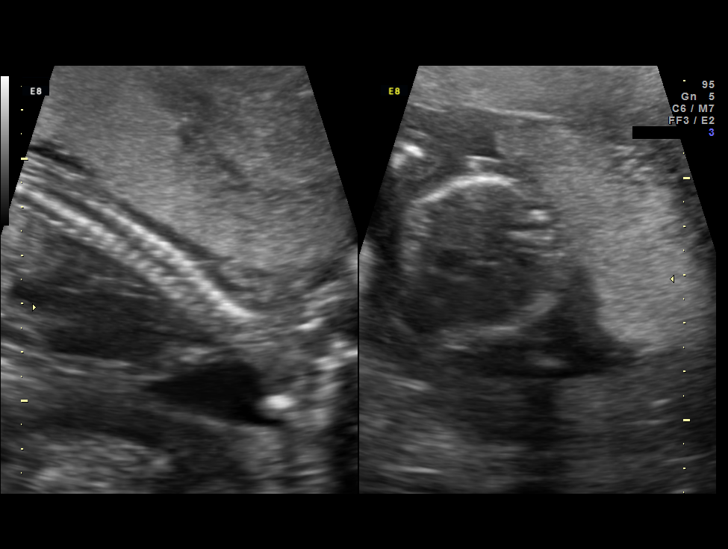
[im 48/77]
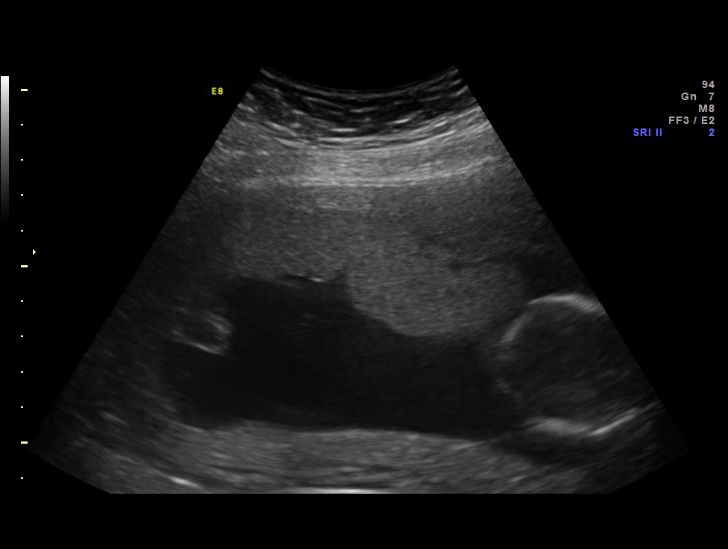
[im 54/77]
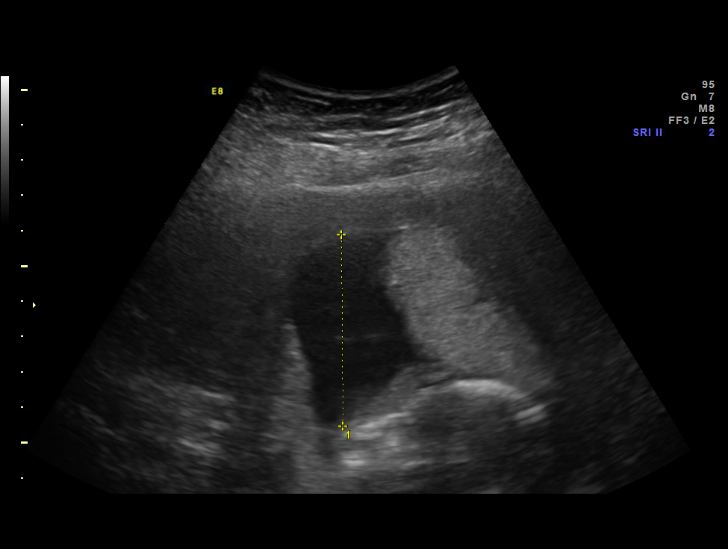
[im 62/77]
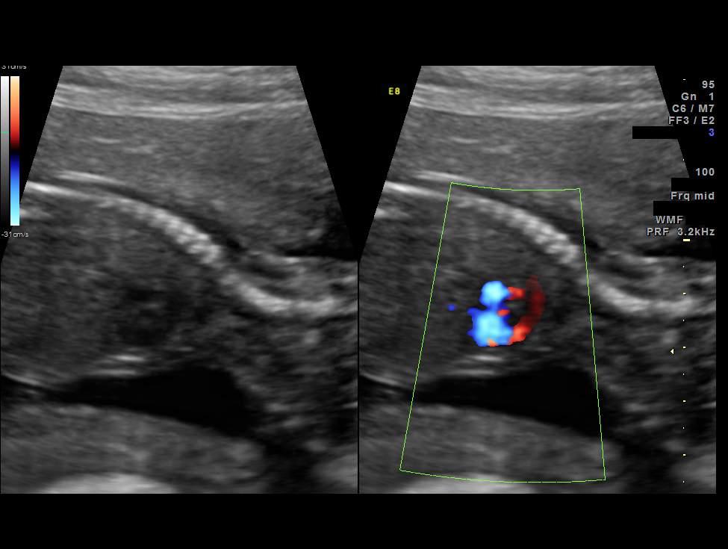
[im 68/77]
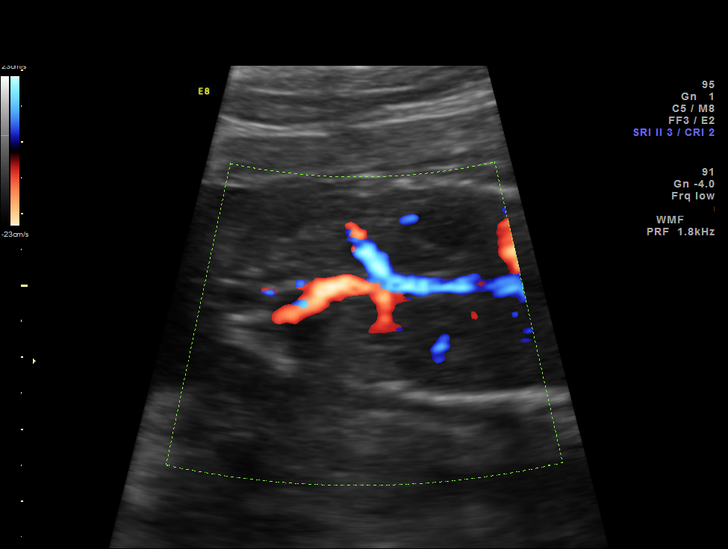
[im 74/77]
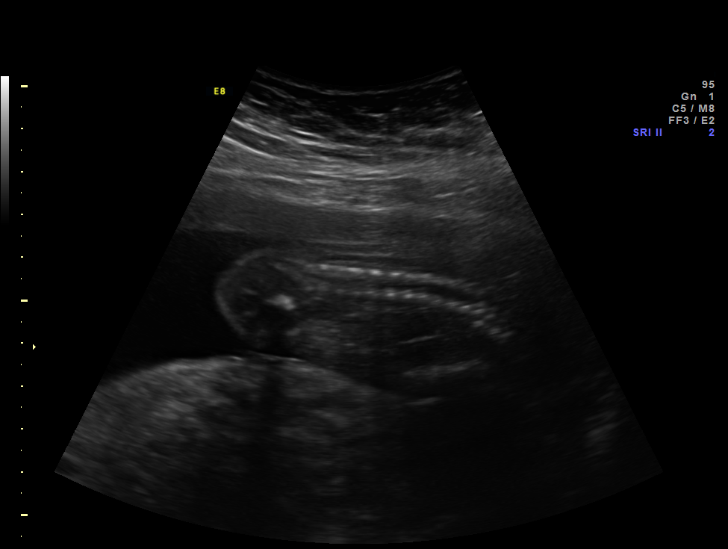

[12 of 28 positions shown; findings below may reference images not displayed]

OBSTETRICS REPORT
                      (Signed Final 08/14/2012 [DATE])

Service(s) Provided

 US OB DETAIL + 14 WK                                  76811.0
Indications

 Detailed fetal anatomic survey
 Advanced maternal age (AMA), Multigravida (38);
 screen negative [facility]
Fetal Evaluation

 Num Of Fetuses:    1
 Fetal Heart Rate:  159                          bpm
 Cardiac Activity:  Observed
 Presentation:      Variable
 Placenta:          Anterior, above cervical os
 P. Cord            Visualized, central
 Insertion:

 Amniotic Fluid
 AFI FV:      Subjectively within normal limits
                                             Larg Pckt:     5.4  cm
Biometry

 BPD:     37.6  mm     G. Age:  17w 3d                CI:         79.7   70 - 86
 OFD:     47.2  mm                                    FL/HC:      17.6   15.8 -
                                                                         18
 HC:     136.3  mm     G. Age:  17w 1d        6  %    HC/AC:      1.07   1.07 -

 AC:     127.2  mm     G. Age:  18w 2d       53  %    FL/BPD:
 FL:        24  mm     G. Age:  17w 1d       16  %    FL/AC:      18.9   20 - 24
 HUM:     23.4  mm     G. Age:  17w 2d       27  %
 NFT:      1.5  mm

 Est. FW:     207  gm      0 lb 7 oz     39  %
Gestational Age

 LMP:           18w 1d        Date:  04/08/12                 EDD:   01/13/13
 U/S Today:     17w 4d                                        EDD:   01/17/13
 Best:          18w 1d     Det. By:  LMP  (04/08/12)          EDD:   01/13/13
Anatomy
 Cranium:          Appears normal         Aortic Arch:      Appears normal
 Fetal Cavum:      Appears normal         Ductal Arch:      Appears normal
 Ventricles:       Appears normal         Diaphragm:        Appears normal
 Choroid Plexus:   Appears normal         Stomach:          See comments
 Cerebellum:       Appears normal         Abdomen:          Appears normal
 Posterior Fossa:  Appears normal         Abdominal Wall:   Appears nml (cord
                                                            insert, abd wall)
 Nuchal Fold:      Appears normal         Cord Vessels:     Appears normal (3
                                                            vessel cord)
 Face:             Orbits appear          Kidneys:          Appear normal
                   normal
 Lips:             Not well visualized    Bladder:          Appears normal
 Heart:            Appears normal         Spine:            Appears normal
                   (4CH, axis, and
                   situs)
 RVOT:             Appears normal         Lower             Appears normal
                                          Extremities:
 LVOT:             Appears normal         Upper             Appears normal
                                          Extremities:

 Other:  Heels and 5th digit appear normal. Fetus appears to be a male.
         Technically difficult due to fetal position.
Cervix Uterus Adnexa

 Cervical Length:    3.5      cm

 Cervix:       Normal appearance by transabdominal scan.
 Uterus:       Single fibroid noted, see table below.
 Cul De Sac:   No free fluid seen.

 Left Ovary:    Not visualized.
 Right Ovary:   Not visualized.
 Adnexa:     No abnormality visualized.
Myomas

 Site                     L(cm)      W(cm)       D(cm)      Location
 Right                    7.5        5           5.7        Intramural

 Blood Flow                  RI       PI       Comments

Impression

 IUP at 18+1 weeks
 Normal detailed fetal anatomy; stomach remained prominent
 throughout study; face views suboptimally visualized
 Markers of aneuploidy: none
 Normal amniotic fluid volume
 Measurements consistent with LMP dating

Recommendations

 Follow-up ultrasound in 4 weeks to reassess stomach and
 complete anatomic survey

## 2014-12-26 ENCOUNTER — Other Ambulatory Visit: Payer: Self-pay

## 2014-12-26 DIAGNOSIS — Z1231 Encounter for screening mammogram for malignant neoplasm of breast: Secondary | ICD-10-CM

## 2014-12-30 ENCOUNTER — Ambulatory Visit
Admission: RE | Admit: 2014-12-30 | Discharge: 2014-12-30 | Disposition: A | Discharge status: Home / Self Care | Payer: BLUE CROSS/BLUE SHIELD | Source: Ambulatory Visit

## 2014-12-30 DIAGNOSIS — Z1231 Encounter for screening mammogram for malignant neoplasm of breast: Secondary | ICD-10-CM

## 2015-01-02 ENCOUNTER — Other Ambulatory Visit: Payer: Self-pay | Admitting: Obstetrics and Gynecology

## 2015-01-02 DIAGNOSIS — R928 Other abnormal and inconclusive findings on diagnostic imaging of breast: Secondary | ICD-10-CM

## 2015-01-13 ENCOUNTER — Other Ambulatory Visit: Payer: BLUE CROSS/BLUE SHIELD

## 2015-01-20 ENCOUNTER — Ambulatory Visit
Admission: RE | Admit: 2015-01-20 | Discharge: 2015-01-20 | Disposition: A | Discharge status: Home / Self Care | Payer: BLUE CROSS/BLUE SHIELD | Source: Ambulatory Visit | Attending: Obstetrics and Gynecology | Admitting: Obstetrics and Gynecology

## 2015-01-20 DIAGNOSIS — R928 Other abnormal and inconclusive findings on diagnostic imaging of breast: Secondary | ICD-10-CM

## 2015-03-03 ENCOUNTER — Other Ambulatory Visit: Payer: Self-pay | Admitting: Obstetrics and Gynecology

## 2015-03-03 ENCOUNTER — Other Ambulatory Visit (HOSPITAL_COMMUNITY)
Admission: RE | Admit: 2015-03-03 | Discharge: 2015-03-03 | Disposition: A | Discharge status: Home / Self Care | Payer: BLUE CROSS/BLUE SHIELD | Source: Ambulatory Visit | Attending: Obstetrics and Gynecology | Admitting: Obstetrics and Gynecology

## 2015-03-03 DIAGNOSIS — Z1151 Encounter for screening for human papillomavirus (HPV): Secondary | ICD-10-CM | POA: Insufficient documentation

## 2015-03-03 DIAGNOSIS — Z01419 Encounter for gynecological examination (general) (routine) without abnormal findings: Secondary | ICD-10-CM | POA: Insufficient documentation

## 2015-03-03 DIAGNOSIS — N760 Acute vaginitis: Secondary | ICD-10-CM | POA: Insufficient documentation

## 2015-03-08 LAB — CYTOLOGY - PAP

## 2015-10-28 ENCOUNTER — Emergency Department (HOSPITAL_COMMUNITY)
Admission: EM | Admit: 2015-10-28 | Discharge: 2015-10-28 | Disposition: A | Discharge status: Home / Self Care | Payer: Commercial Managed Care - PPO | Attending: Emergency Medicine | Admitting: Emergency Medicine

## 2015-10-28 ENCOUNTER — Encounter (HOSPITAL_COMMUNITY): Payer: Self-pay | Admitting: Emergency Medicine

## 2015-10-28 DIAGNOSIS — Z791 Long term (current) use of non-steroidal anti-inflammatories (NSAID): Secondary | ICD-10-CM | POA: Insufficient documentation

## 2015-10-28 DIAGNOSIS — Z79899 Other long term (current) drug therapy: Secondary | ICD-10-CM | POA: Insufficient documentation

## 2015-10-28 DIAGNOSIS — R1031 Right lower quadrant pain: Secondary | ICD-10-CM | POA: Insufficient documentation

## 2015-10-28 LAB — URINE MICROSCOPIC-ADD ON

## 2015-10-28 LAB — URINALYSIS, ROUTINE W REFLEX MICROSCOPIC
BILIRUBIN URINE: NEGATIVE
GLUCOSE, UA: NEGATIVE mg/dL
Ketones, ur: NEGATIVE mg/dL
Nitrite: NEGATIVE
PH: 8 (ref 5.0–8.0)
Protein, ur: NEGATIVE mg/dL
SPECIFIC GRAVITY, URINE: 1.025 (ref 1.005–1.030)

## 2015-10-28 LAB — COMPREHENSIVE METABOLIC PANEL
ALBUMIN: 3.4 g/dL — AB (ref 3.5–5.0)
ALK PHOS: 56 U/L (ref 38–126)
ALT: 22 U/L (ref 14–54)
ANION GAP: 9 (ref 5–15)
AST: 19 U/L (ref 15–41)
BUN: 11 mg/dL (ref 6–20)
CALCIUM: 10.4 mg/dL — AB (ref 8.9–10.3)
CHLORIDE: 101 mmol/L (ref 101–111)
CO2: 27 mmol/L (ref 22–32)
Creatinine, Ser: 0.74 mg/dL (ref 0.44–1.00)
GFR calc non Af Amer: >60 mL/min (ref >60)
GLUCOSE: 102 mg/dL — AB (ref 65–99)
Potassium: 4.2 mmol/L (ref 3.5–5.1)
SODIUM: 137 mmol/L (ref 135–145)
Total Bilirubin: 0.5 mg/dL (ref 0.3–1.2)
Total Protein: 6.9 g/dL (ref 6.5–8.1)

## 2015-10-28 LAB — CBC
HEMATOCRIT: 37.6 % (ref 36.0–46.0)
HEMOGLOBIN: 12.7 g/dL (ref 12.0–15.0)
MCH: 28.3 pg (ref 26.0–34.0)
MCHC: 33.8 g/dL (ref 30.0–36.0)
MCV: 83.7 fL (ref 78.0–100.0)
Platelets: 243 10*3/uL (ref 150–400)
RBC: 4.49 MIL/uL (ref 3.87–5.11)
RDW: 12.6 % (ref 11.5–15.5)
WBC: 9.1 10*3/uL (ref 4.0–10.5)

## 2015-10-28 LAB — LIPASE, BLOOD: LIPASE: 27 U/L (ref 11–51)

## 2015-10-28 LAB — POC URINE PREG, ED: PREG TEST UR: NEGATIVE

## 2015-10-28 MED ORDER — OXYCODONE-ACETAMINOPHEN 5-325 MG PO TABS
2.0000 | ORAL_TABLET | Freq: Once | ORAL | Status: AC
  Administered 2015-10-28: 2 via ORAL
  Filled 2015-10-28: qty 2

## 2015-10-28 MED ORDER — IBUPROFEN 600 MG PO TABS: 600.0000 mg | ORAL_TABLET | Freq: Three times a day (TID) | ORAL | Status: AC | PRN

## 2015-10-28 NOTE — ED Notes (Signed)
C/o R sided abd pain x 2 days.  Denies nausea, vomiting, diarrhea, or constipation.  States menstrual cycle started 2 days ago and she has had similar pain before with period.

## 2015-10-28 NOTE — ED Provider Notes (Signed)
CSN: [id number]     Arrival date & time 10/28/15  0136 History   First MD Initiated Contact with Patient 10/28/15 [phone]     Chief Complaint  Patient presents with  . Abdominal Pain     Patient is a 40 y.o. female presenting with abdominal pain. The history is provided by the patient and the spouse. A language interpreter was used ([place] - [phone]).  Abdominal Pain Pain location:  RLQ Pain quality: aching   Pain radiates to:  Does not radiate Pain severity:  Moderate Onset quality:  Gradual Duration:  3 days Timing:  Constant Progression:  Unchanged Chronicity:  New Relieved by:  Nothing Worsened by:  Movement and palpation Associated symptoms: vaginal bleeding   Associated symptoms: no chest pain, no diarrhea, no dysuria, no fever, no vaginal discharge and no vomiting   Patient reports onset of RLQ pain 3 days ago She is currently on menstrual cycle and is having vaginal bleeding No back pain No fever No dysuria   Past Medical History  Diagnosis Date  . Allergy   . SVD (spontaneous vaginal delivery) 01/05/2013   Past Surgical History  Procedure Laterality Date  . Appendectomy     No family history on file. Social History  Substance Use Topics  . Smoking status: Never Smoker   . Smokeless tobacco: None  . Alcohol Use: No   OB History    Gravida Para Term Preterm AB TAB SAB Ectopic Multiple Living   0 3 3   0 1     Review of Systems  Constitutional: Negative for fever.  Cardiovascular: Negative for chest pain.  Gastrointestinal: Positive for abdominal pain. Negative for vomiting and diarrhea.  Genitourinary: Positive for vaginal bleeding. Negative for dysuria and vaginal discharge.  All other systems reviewed and are negative.     Allergies  Review of patient's allergies indicates no known allergies.  Home Medications   Prior to Admission medications   Medication Sig Start Date End Date Taking? Authorizing Provider  Ascorbic Acid (VITAMIN C) 100 MG  tablet Take 100 mg by mouth every other day.     Historical Provider, MD  ibuprofen (ADVIL,MOTRIN) 600 MG tablet Take 1 tablet (600 mg total) by mouth every 8 (eight) hours as needed. 10/28/15   Zadie Rhine, MD  pramoxine-hydrocortisone (EPIFOAM) 1-1 % foam Apply topically 3 (three) times daily. 01/07/13   Geryl Rankins, MD   BP 142/91 mmHg  Pulse 54  Temp(Src) 97.7 F (36.5 C) (Oral)  Resp 16  SpO2 99%  LMP 10/26/2015 Physical Exam CONSTITUTIONAL: Well developed/well nourished HEAD: Normocephalic/atraumatic EYES: EOMI ENMT: Mucous membranes moist NECK: supple no meningeal signs SPINE/BACK:entire spine nontender CV: S1/S2 noted, no murmurs/rubs/gallops noted LUNGS: Lungs are clear to auscultation bilaterally, no apparent distress ABDOMEN: soft, mild RLQ tenderness, no rebound or guarding, bowel sounds noted throughout abdomen GU:no cva tenderness, vaginal bleeding noted.  No uterine tenderness.  No adnexal tenderness/mass noted. Female chaperone present for exam NEURO: Pt is awake/alert/appropriate, moves all extremitiesx4.  No facial droop.   EXTREMITIES: pulses normal/equal, full ROM SKIN: warm, color normal PSYCH: no abnormalities of mood noted, alert and oriented to situation  ED Course  Procedures  Medications  oxyCODONE-acetaminophen (PERCOCET/ROXICET) 5-325 MG per tablet 2 tablet (2 tablets Oral Given 10/28/15 0538)    Labs Review Labs Reviewed  COMPREHENSIVE METABOLIC PANEL - Abnormal; Notable [id number] following:    Glucose, Bld 102 (*)    Calcium 10.4 (*)    Albumin  3.4 (*)    All other components within normal limits  URINALYSIS, ROUTINE W REFLEX MICROSCOPIC (NOT AT [facility]) - Abnormal; Notable for the following:    Color, Urine ORANGE (*)    APPearance TURBID (*)    Hgb urine dipstick LARGE (*)    Leukocytes, UA SMALL (*)    All other components within normal limits  URINE MICROSCOPIC-ADD ON - Abnormal; Notable for the following:    Squamous Epithelial / LPF 0-5  (*)    Bacteria, UA FEW (*)    All other components within normal limits  LIPASE, BLOOD  CBC  POC URINE PREG, ED    I have personally reviewed and evaluated these images and lab results as part of my medical decision-making.   Pt well appearing She is s/p appendectomy On reassessment, pain is improved No rebound/guarding to suggest acute abdominal emergency Her pelvic exam was unremarkable I doubt TOA/torsion I don't feel further evaluation required Pt does speak some english and we discussed followup with OBGYN and ibuprofen for pain  MDM   Final diagnoses:  Right lower quadrant abdominal pain    Nursing notes including past medical history and social history reviewed and considered in documentation Labs/vital reviewed myself and considered during evaluation     Zadie Rhineonald Fawaz Borquez, MD 10/28/15 [phone]

## 2015-10-28 NOTE — Discharge Instructions (Signed)

## 2016-03-04 ENCOUNTER — Other Ambulatory Visit: Payer: Self-pay | Admitting: Obstetrics and Gynecology

## 2016-03-04 ENCOUNTER — Other Ambulatory Visit (HOSPITAL_COMMUNITY)
Admission: RE | Admit: 2016-03-04 | Discharge: 2016-03-04 | Disposition: A | Discharge status: Home / Self Care | Payer: Commercial Managed Care - PPO | Source: Ambulatory Visit | Attending: Obstetrics and Gynecology | Admitting: Obstetrics and Gynecology

## 2016-03-04 DIAGNOSIS — Z01419 Encounter for gynecological examination (general) (routine) without abnormal findings: Secondary | ICD-10-CM | POA: Insufficient documentation

## 2016-03-05 LAB — CYTOLOGY - PAP

## 2016-03-28 ENCOUNTER — Other Ambulatory Visit: Payer: Self-pay | Admitting: Obstetrics and Gynecology

## 2016-03-28 DIAGNOSIS — Z1231 Encounter for screening mammogram for malignant neoplasm of breast: Secondary | ICD-10-CM

## 2016-04-26 ENCOUNTER — Ambulatory Visit
Admission: RE | Admit: 2016-04-26 | Discharge: 2016-04-26 | Disposition: A | Discharge status: Home / Self Care | Payer: Commercial Managed Care - PPO | Source: Ambulatory Visit | Attending: Obstetrics and Gynecology | Admitting: Obstetrics and Gynecology

## 2016-04-26 DIAGNOSIS — Z1231 Encounter for screening mammogram for malignant neoplasm of breast: Secondary | ICD-10-CM

## 2016-08-12 DIAGNOSIS — Z0000 Encounter for general adult medical examination without abnormal findings: Secondary | ICD-10-CM | POA: Diagnosis not present

## 2016-09-02 DIAGNOSIS — N924 Excessive bleeding in the premenopausal period: Secondary | ICD-10-CM | POA: Diagnosis not present

## 2017-03-06 DIAGNOSIS — Z01411 Encounter for gynecological examination (general) (routine) with abnormal findings: Secondary | ICD-10-CM | POA: Diagnosis not present

## 2017-05-12 ENCOUNTER — Other Ambulatory Visit: Payer: Self-pay | Admitting: Obstetrics and Gynecology

## 2017-05-12 DIAGNOSIS — Z139 Encounter for screening, unspecified: Secondary | ICD-10-CM

## 2017-06-10 ENCOUNTER — Ambulatory Visit
Admission: RE | Admit: 2017-06-10 | Discharge: 2017-06-10 | Disposition: A | Discharge status: Home / Self Care | Payer: Commercial Managed Care - PPO | Source: Ambulatory Visit | Attending: Obstetrics and Gynecology | Admitting: Obstetrics and Gynecology

## 2017-06-10 DIAGNOSIS — Z139 Encounter for screening, unspecified: Secondary | ICD-10-CM

## 2017-06-10 DIAGNOSIS — Z1231 Encounter for screening mammogram for malignant neoplasm of breast: Secondary | ICD-10-CM | POA: Diagnosis not present

## 2017-08-15 DIAGNOSIS — Z0000 Encounter for general adult medical examination without abnormal findings: Secondary | ICD-10-CM | POA: Diagnosis not present

## 2017-08-15 DIAGNOSIS — R51 Headache: Secondary | ICD-10-CM | POA: Diagnosis not present

## 2018-03-27 ENCOUNTER — Other Ambulatory Visit: Payer: Self-pay | Admitting: Obstetrics and Gynecology

## 2018-03-27 ENCOUNTER — Other Ambulatory Visit (HOSPITAL_COMMUNITY)
Admission: RE | Admit: 2018-03-27 | Discharge: 2018-03-27 | Disposition: A | Discharge status: Home / Self Care | Payer: Commercial Managed Care - PPO | Source: Ambulatory Visit | Attending: Obstetrics and Gynecology | Admitting: Obstetrics and Gynecology

## 2018-03-27 DIAGNOSIS — Z01411 Encounter for gynecological examination (general) (routine) with abnormal findings: Secondary | ICD-10-CM | POA: Insufficient documentation

## 2018-03-27 DIAGNOSIS — Z01419 Encounter for gynecological examination (general) (routine) without abnormal findings: Secondary | ICD-10-CM | POA: Diagnosis not present

## 2018-03-31 LAB — CYTOLOGY - PAP
DIAGNOSIS: REACTIVE
Diagnosis: NEGATIVE
HPV (WINDOPATH): NOT DETECTED

## 2018-06-02 ENCOUNTER — Other Ambulatory Visit: Payer: Self-pay | Admitting: Obstetrics and Gynecology

## 2018-06-02 DIAGNOSIS — Z1231 Encounter for screening mammogram for malignant neoplasm of breast: Secondary | ICD-10-CM

## 2018-07-01 ENCOUNTER — Ambulatory Visit: Payer: Commercial Managed Care - PPO

## 2018-07-08 ENCOUNTER — Ambulatory Visit
Admission: RE | Admit: 2018-07-08 | Discharge: 2018-07-08 | Disposition: A | Discharge status: Home / Self Care | Payer: Commercial Managed Care - PPO | Source: Ambulatory Visit | Attending: Obstetrics and Gynecology | Admitting: Obstetrics and Gynecology

## 2018-07-08 DIAGNOSIS — Z1231 Encounter for screening mammogram for malignant neoplasm of breast: Secondary | ICD-10-CM | POA: Diagnosis not present

## 2018-07-08 DIAGNOSIS — Z23 Encounter for immunization: Secondary | ICD-10-CM | POA: Diagnosis not present

## 2018-08-19 DIAGNOSIS — Z13220 Encounter for screening for lipoid disorders: Secondary | ICD-10-CM | POA: Diagnosis not present

## 2018-08-19 DIAGNOSIS — Z131 Encounter for screening for diabetes mellitus: Secondary | ICD-10-CM | POA: Diagnosis not present

## 2018-08-19 DIAGNOSIS — Z0000 Encounter for general adult medical examination without abnormal findings: Secondary | ICD-10-CM | POA: Diagnosis not present

## 2018-08-19 DIAGNOSIS — Z23 Encounter for immunization: Secondary | ICD-10-CM | POA: Diagnosis not present

## 2018-09-11 DIAGNOSIS — Z23 Encounter for immunization: Secondary | ICD-10-CM | POA: Diagnosis not present

## 2019-09-07 ENCOUNTER — Other Ambulatory Visit: Payer: Self-pay | Admitting: Obstetrics and Gynecology

## 2019-09-07 DIAGNOSIS — Z1231 Encounter for screening mammogram for malignant neoplasm of breast: Secondary | ICD-10-CM

## 2019-09-17 ENCOUNTER — Other Ambulatory Visit: Payer: Self-pay

## 2019-09-17 ENCOUNTER — Ambulatory Visit
Admission: RE | Admit: 2019-09-17 | Discharge: 2019-09-17 | Disposition: A | Discharge status: Home / Self Care | Payer: BC Managed Care – PPO | Source: Ambulatory Visit | Attending: Obstetrics and Gynecology | Admitting: Obstetrics and Gynecology

## 2019-09-17 DIAGNOSIS — Z1231 Encounter for screening mammogram for malignant neoplasm of breast: Secondary | ICD-10-CM

## 2020-09-18 ENCOUNTER — Other Ambulatory Visit: Payer: Self-pay | Admitting: Family Medicine

## 2020-09-18 DIAGNOSIS — Z1231 Encounter for screening mammogram for malignant neoplasm of breast: Secondary | ICD-10-CM

## 2020-10-18 IMAGING — MG DIGITAL SCREENING BILATERAL MAMMOGRAM WITH TOMO AND CAD
8 series · 9 of 24 positions shown · non-contrast
Comparison: Previous exam(s).

CLINICAL DATA: Screening.

EXAM:
DIGITAL SCREENING BILATERAL MAMMOGRAM WITH TOMO AND CAD

[L CC synth-2D]
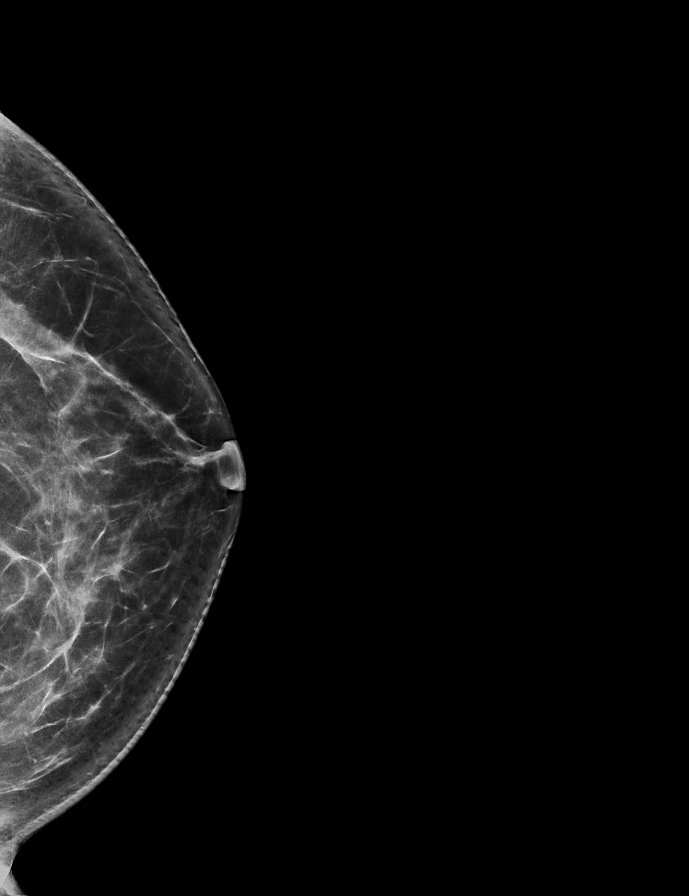

[R CC synth-2D]
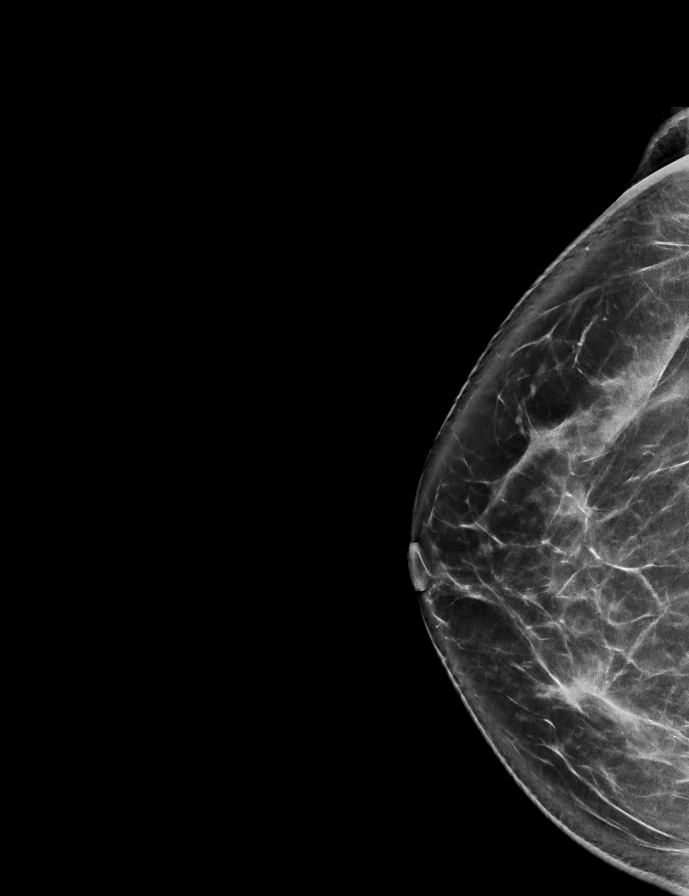

[R MLO synth-2D]
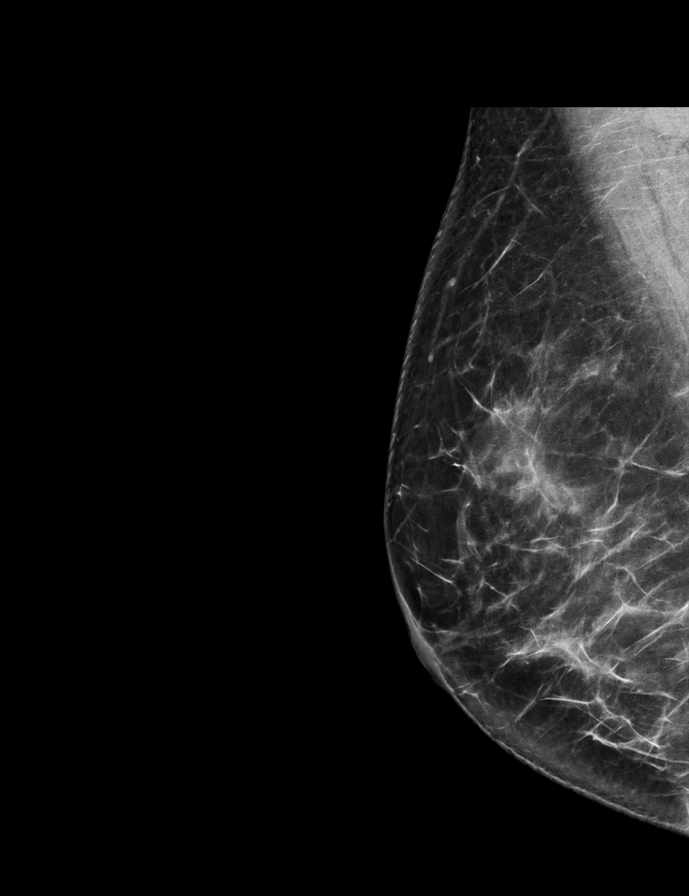

[L MLO synth-2D]
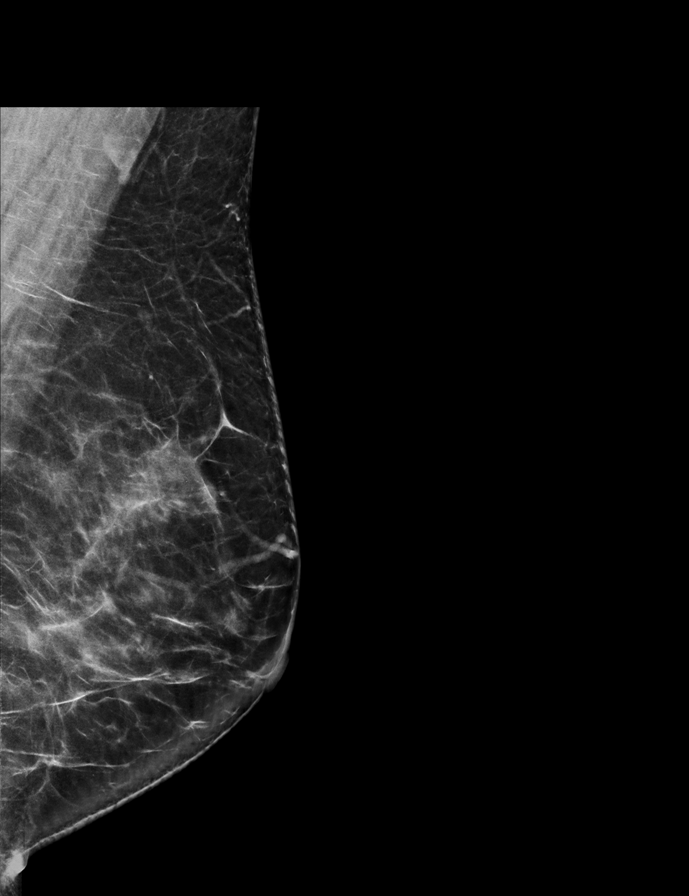

[L MLO tomo · 2 of 77 frames shown]
[frame 25/77]
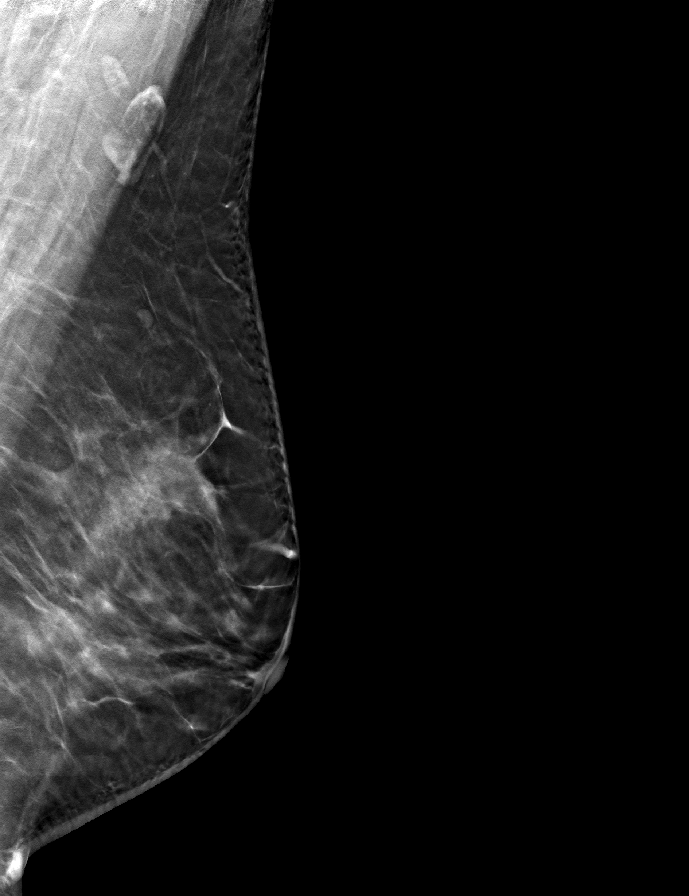
[frame 39/77]
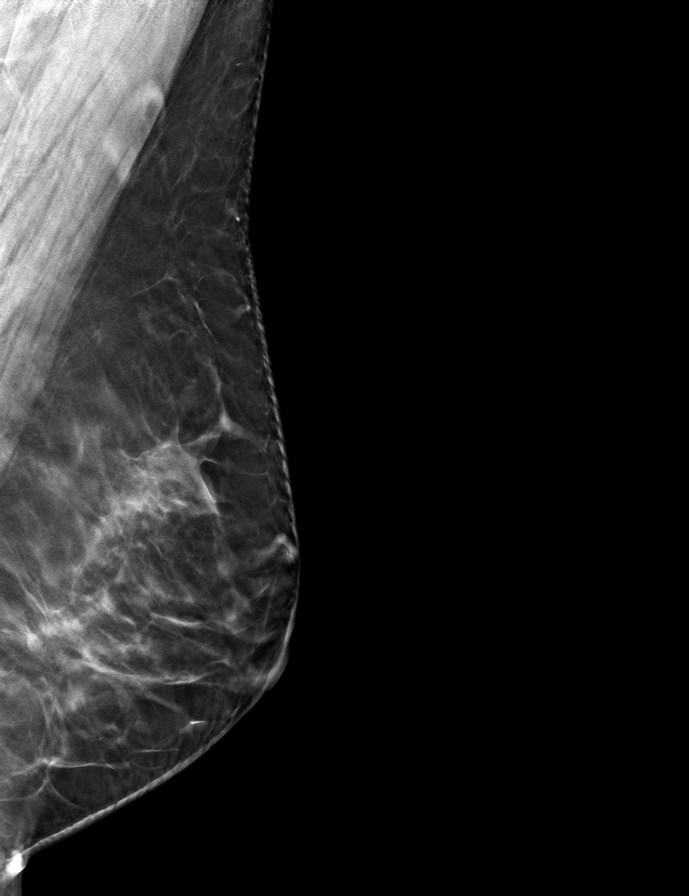

[L CC tomo · tomo slice 37/74.0]
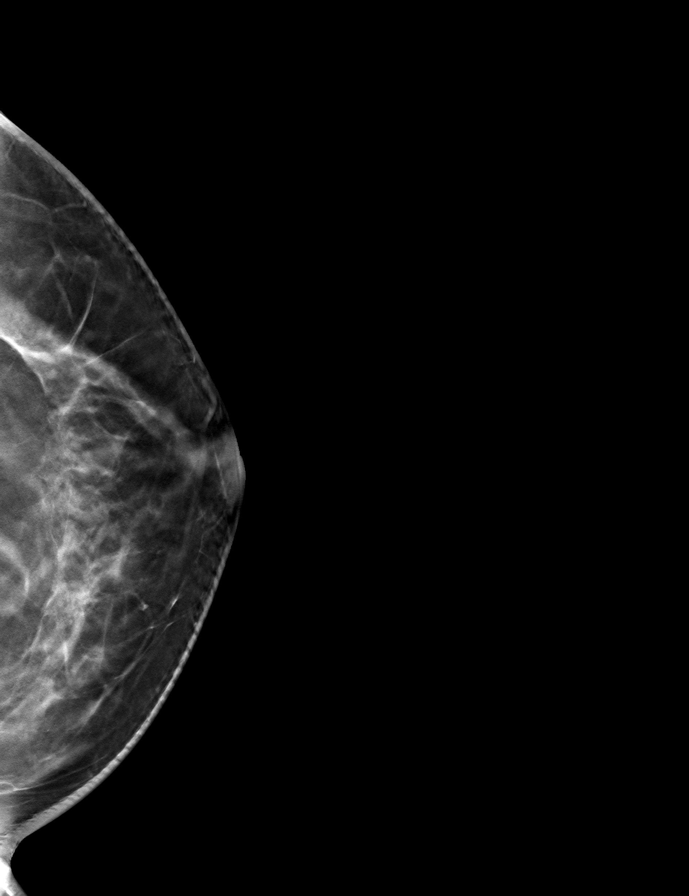

[R MLO tomo · tomo slice 39/77.0]
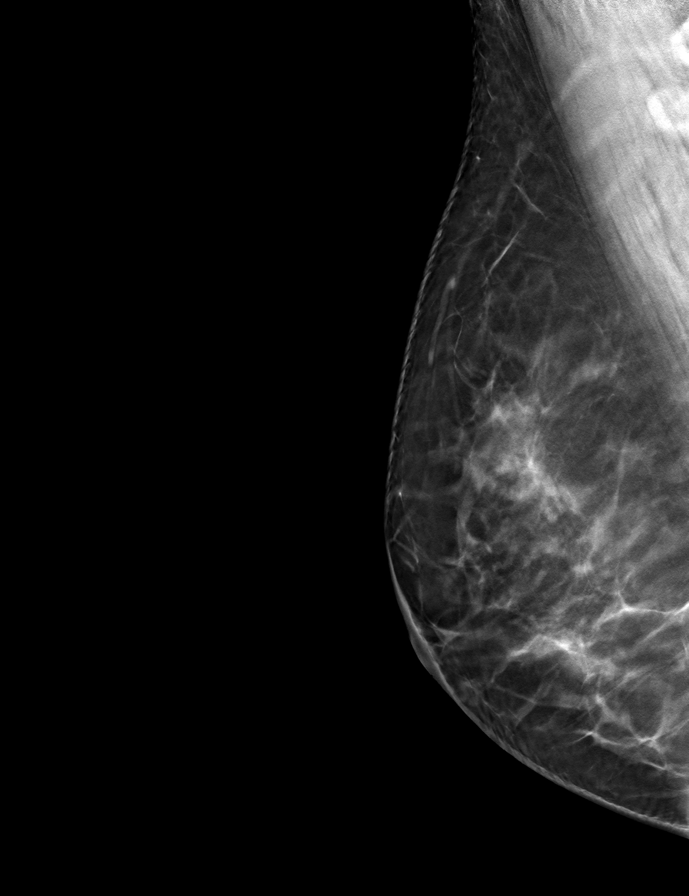

[R CC tomo · tomo slice 43/85.0]
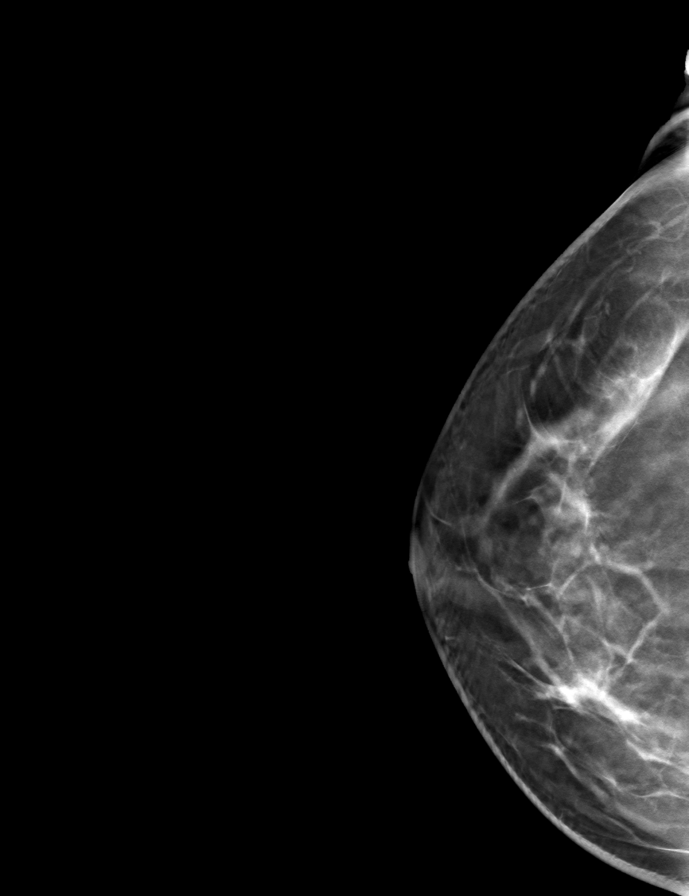

[9 of 24 positions shown; findings below may reference images not displayed]

ACR Breast Density Category c: The breast tissue is heterogeneously
dense, which may obscure small masses.
FINDINGS: There are no findings suspicious for malignancy. Images were
processed with CAD.
IMPRESSION: No mammographic evidence of malignancy. A result letter of this
screening mammogram will be mailed directly to the patient.

RECOMMENDATION:
Screening mammogram in one year. (Code:[id number])

BI-RADS CATEGORY  1: Negative.

## 2024-03-22 ENCOUNTER — Other Ambulatory Visit: Payer: Self-pay | Admitting: Internal Medicine

## 2024-03-22 DIAGNOSIS — Z1231 Encounter for screening mammogram for malignant neoplasm of breast: Secondary | ICD-10-CM

## 2024-04-10 ENCOUNTER — Ambulatory Visit (HOSPITAL_BASED_OUTPATIENT_CLINIC_OR_DEPARTMENT_OTHER)
Admission: RE | Admit: 2024-04-10 | Discharge: 2024-04-10 | Disposition: A | Discharge status: Home / Self Care | Payer: Self-pay | Source: Ambulatory Visit | Attending: Internal Medicine | Admitting: Internal Medicine

## 2024-04-10 DIAGNOSIS — Z1231 Encounter for screening mammogram for malignant neoplasm of breast: Secondary | ICD-10-CM | POA: Insufficient documentation
